# Patient Record
Sex: Female | Born: 1935 | Race: White | Hispanic: No | Smoking: Former smoker
Health system: Southern US, Community
[De-identification: ages and names within clinical notes are randomized; demographics above are authoritative.]

## PROBLEM LIST (undated history)

## (undated) DIAGNOSIS — M810 Age-related osteoporosis without current pathological fracture: Secondary | ICD-10-CM

## (undated) DIAGNOSIS — IMO0001 Reserved for inherently not codable concepts without codable children: Secondary | ICD-10-CM

## (undated) DIAGNOSIS — I350 Nonrheumatic aortic (valve) stenosis: Secondary | ICD-10-CM

## (undated) DIAGNOSIS — I10 Essential (primary) hypertension: Secondary | ICD-10-CM

## (undated) DIAGNOSIS — K635 Polyp of colon: Secondary | ICD-10-CM

## (undated) DIAGNOSIS — J302 Other seasonal allergic rhinitis: Secondary | ICD-10-CM

## (undated) DIAGNOSIS — E7800 Pure hypercholesterolemia, unspecified: Secondary | ICD-10-CM

## (undated) DIAGNOSIS — K449 Diaphragmatic hernia without obstruction or gangrene: Secondary | ICD-10-CM

## (undated) DIAGNOSIS — H9190 Unspecified hearing loss, unspecified ear: Secondary | ICD-10-CM

## (undated) DIAGNOSIS — Z85828 Personal history of other malignant neoplasm of skin: Secondary | ICD-10-CM

## (undated) HISTORY — DX: Age-related osteoporosis without current pathological fracture: M81.0

## (undated) HISTORY — DX: Nonrheumatic aortic (valve) stenosis: I35.0

## (undated) HISTORY — DX: Polyp of colon: K63.5

## (undated) HISTORY — DX: Pure hypercholesterolemia, unspecified: E78.00

## (undated) HISTORY — DX: Other seasonal allergic rhinitis: J30.2

## (undated) HISTORY — DX: Personal history of other malignant neoplasm of skin: Z85.828

## (undated) HISTORY — DX: Reserved for inherently not codable concepts without codable children: IMO0001

## (undated) HISTORY — DX: Diaphragmatic hernia without obstruction or gangrene: K44.9

## (undated) HISTORY — DX: Unspecified hearing loss, unspecified ear: H91.90

## (undated) HISTORY — DX: Essential (primary) hypertension: I10

---

## 1998-10-09 ENCOUNTER — Other Ambulatory Visit: Admission: RE | Admit: 1998-10-09 | Discharge: 1998-10-09 | Payer: Self-pay | Admitting: *Deleted

## 1999-09-06 ENCOUNTER — Emergency Department (HOSPITAL_COMMUNITY): Admission: EM | Admit: 1999-09-06 | Discharge: 1999-09-06 | Payer: Self-pay | Admitting: Emergency Medicine

## 1999-12-17 ENCOUNTER — Encounter: Payer: Self-pay | Admitting: Geriatric Medicine

## 1999-12-17 ENCOUNTER — Encounter: Admission: RE | Admit: 1999-12-17 | Discharge: 1999-12-17 | Payer: Self-pay | Admitting: Geriatric Medicine

## 2000-04-21 ENCOUNTER — Other Ambulatory Visit: Admission: RE | Admit: 2000-04-21 | Discharge: 2000-04-21 | Payer: Self-pay | Admitting: *Deleted

## 2001-01-19 ENCOUNTER — Encounter: Admission: RE | Admit: 2001-01-19 | Discharge: 2001-04-19 | Payer: Self-pay | Admitting: Radiation Oncology

## 2001-03-03 ENCOUNTER — Encounter: Admission: RE | Admit: 2001-03-03 | Discharge: 2001-03-03 | Payer: Self-pay | Admitting: Geriatric Medicine

## 2001-03-03 ENCOUNTER — Encounter: Payer: Self-pay | Admitting: Geriatric Medicine

## 2001-04-14 ENCOUNTER — Encounter: Admission: RE | Admit: 2001-04-14 | Discharge: 2001-04-14 | Payer: Self-pay | Admitting: Geriatric Medicine

## 2001-04-14 ENCOUNTER — Encounter: Payer: Self-pay | Admitting: Geriatric Medicine

## 2001-04-24 ENCOUNTER — Other Ambulatory Visit: Admission: RE | Admit: 2001-04-24 | Discharge: 2001-04-24 | Payer: Self-pay | Admitting: *Deleted

## 2001-05-11 ENCOUNTER — Encounter: Payer: Self-pay | Admitting: Geriatric Medicine

## 2001-05-11 ENCOUNTER — Encounter: Admission: RE | Admit: 2001-05-11 | Discharge: 2001-05-11 | Payer: Self-pay | Admitting: Geriatric Medicine

## 2003-06-11 ENCOUNTER — Other Ambulatory Visit: Admission: RE | Admit: 2003-06-11 | Discharge: 2003-06-11 | Payer: Self-pay | Admitting: Geriatric Medicine

## 2005-05-12 ENCOUNTER — Ambulatory Visit (HOSPITAL_COMMUNITY): Admission: RE | Admit: 2005-05-12 | Discharge: 2005-05-12 | Payer: Self-pay | Admitting: Geriatric Medicine

## 2005-09-17 ENCOUNTER — Emergency Department (HOSPITAL_COMMUNITY): Admission: EM | Admit: 2005-09-17 | Discharge: 2005-09-17 | Payer: Self-pay | Admitting: Family Medicine

## 2005-12-31 ENCOUNTER — Other Ambulatory Visit: Admission: RE | Admit: 2005-12-31 | Discharge: 2005-12-31 | Payer: Self-pay | Admitting: Urology

## 2006-06-07 ENCOUNTER — Encounter: Admission: RE | Admit: 2006-06-07 | Discharge: 2006-06-07 | Payer: Self-pay | Admitting: Geriatric Medicine

## 2009-12-03 ENCOUNTER — Encounter: Admission: RE | Admit: 2009-12-03 | Discharge: 2009-12-03 | Payer: Self-pay | Admitting: Geriatric Medicine

## 2009-12-11 ENCOUNTER — Encounter: Admission: RE | Admit: 2009-12-11 | Discharge: 2009-12-11 | Payer: Self-pay | Admitting: Geriatric Medicine

## 2009-12-31 ENCOUNTER — Other Ambulatory Visit: Admission: RE | Admit: 2009-12-31 | Discharge: 2009-12-31 | Payer: Self-pay | Admitting: Interventional Radiology

## 2009-12-31 ENCOUNTER — Encounter: Admission: RE | Admit: 2009-12-31 | Discharge: 2009-12-31 | Payer: Self-pay | Admitting: Geriatric Medicine

## 2010-10-25 ENCOUNTER — Encounter: Payer: Self-pay | Admitting: Geriatric Medicine

## 2011-01-07 ENCOUNTER — Other Ambulatory Visit: Payer: Self-pay | Admitting: Geriatric Medicine

## 2011-01-07 DIAGNOSIS — E041 Nontoxic single thyroid nodule: Secondary | ICD-10-CM

## 2011-01-08 ENCOUNTER — Ambulatory Visit
Admission: RE | Admit: 2011-01-08 | Discharge: 2011-01-08 | Disposition: A | Discharge status: Home / Self Care | Payer: Medicare Other | Source: Ambulatory Visit | Attending: Geriatric Medicine | Admitting: Geriatric Medicine

## 2011-01-08 DIAGNOSIS — E041 Nontoxic single thyroid nodule: Secondary | ICD-10-CM

## 2012-01-10 ENCOUNTER — Other Ambulatory Visit: Payer: Self-pay | Admitting: Geriatric Medicine

## 2012-01-12 ENCOUNTER — Ambulatory Visit
Admission: RE | Admit: 2012-01-12 | Discharge: 2012-01-12 | Disposition: A | Discharge status: Home / Self Care | Payer: Medicare Other | Source: Ambulatory Visit | Attending: Geriatric Medicine | Admitting: Geriatric Medicine

## 2012-03-23 ENCOUNTER — Other Ambulatory Visit: Payer: Self-pay | Admitting: Dermatology

## 2012-06-01 ENCOUNTER — Other Ambulatory Visit: Payer: Self-pay | Admitting: Dermatology

## 2013-05-08 ENCOUNTER — Other Ambulatory Visit: Payer: Self-pay | Admitting: Dermatology

## 2014-01-21 ENCOUNTER — Ambulatory Visit: Payer: Medicare Other | Admitting: Family Medicine

## 2014-04-11 ENCOUNTER — Other Ambulatory Visit (HOSPITAL_COMMUNITY): Payer: Self-pay | Admitting: Internal Medicine

## 2014-04-11 DIAGNOSIS — R0600 Dyspnea, unspecified: Secondary | ICD-10-CM

## 2014-04-15 ENCOUNTER — Ambulatory Visit (HOSPITAL_COMMUNITY)
Admission: RE | Admit: 2014-04-15 | Discharge: 2014-04-15 | Disposition: A | Discharge status: Home / Self Care | Payer: Medicare HMO | Source: Ambulatory Visit | Attending: Cardiology | Admitting: Cardiology

## 2014-04-15 DIAGNOSIS — R0600 Dyspnea, unspecified: Secondary | ICD-10-CM

## 2014-04-15 DIAGNOSIS — R0609 Other forms of dyspnea: Secondary | ICD-10-CM | POA: Insufficient documentation

## 2014-04-15 DIAGNOSIS — R0989 Other specified symptoms and signs involving the circulatory and respiratory systems: Secondary | ICD-10-CM | POA: Insufficient documentation

## 2014-04-15 DIAGNOSIS — I359 Nonrheumatic aortic valve disorder, unspecified: Secondary | ICD-10-CM

## 2014-04-15 NOTE — Progress Notes (Signed)
2D Echo Performed 04/15/2014    Marygrace Drought, RCS

## 2014-06-03 ENCOUNTER — Institutional Professional Consult (permissible substitution): Payer: Medicare HMO | Admitting: Emergency Medicine

## 2014-06-05 ENCOUNTER — Ambulatory Visit (INDEPENDENT_AMBULATORY_CARE_PROVIDER_SITE_OTHER): Payer: Medicare HMO | Admitting: Pulmonary Disease

## 2014-06-05 ENCOUNTER — Ambulatory Visit: Payer: Medicare HMO | Admitting: Pulmonary Disease

## 2014-06-05 ENCOUNTER — Encounter: Payer: Self-pay | Admitting: Pulmonary Disease

## 2014-06-05 VITALS — BP 128/66 | HR 65 | Ht 64.0 in | Wt 108.0 lb

## 2014-06-05 DIAGNOSIS — Z23 Encounter for immunization: Secondary | ICD-10-CM

## 2014-06-05 DIAGNOSIS — J449 Chronic obstructive pulmonary disease, unspecified: Secondary | ICD-10-CM | POA: Insufficient documentation

## 2014-06-05 NOTE — Progress Notes (Signed)
Subjective:    Patient ID: Valerie Villa, female    DOB: 1923-10-24, 80 y.o.   MRN: [id number]  HPI  Valerie Villa is here to see me today because she was recently told that she had COPD.  She said that she saw a new physician recently and she had a pulmonary function test and was told that the had COPD.  She was prescribed albuterol.  She complained of shortness of breath that day.  Specifically she feels that her breathing is not as good as it used to be years ago.  She was sent for PFTs and told that she had COPD and was prescribed albuterol, but she has a lot of questions about this because she has a lot of allergies.   She says that when she climbs a flight of stairs she is a little short of breath and she typically has to rest before another flight.  She said that when she goes to the beach her condo is on the fourth floor.  There she was really struggling to breathe when she went in May.   She has had a dry cough off and on for years.  Sipping water typically helps this.  This has not worsened lately.  Sometimes she will choke when she coughs.  Again, sipping water helps this too.  She smoked briefly in her 84's but quit by age 51.  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  Her husband quit smoking around this time   Past Medical History  Diagnosis Date  . Hypercholesteremia   . Seasonal allergies   . Osteoporosis   . Hearing impairment   . Hx of basal cell carcinoma      Family History  Problem Relation Age of Onset  . Heart disease Mother   . Heart disease Father   . Cancer Sister     throat     History   Social History  . Marital Status: Widowed    Spouse Name: N/A    Number of Children: N/A  . Years of Education: N/A   Occupational History  . Not on file.   Social History Main Topics  . Smoking status: Former Smoker -- 0.10 packs/day for 5 years    Types: Cigarettes    Quit date: 06/05/1964  . Smokeless tobacco: Never  Used     Comment: "social smoker"  . Alcohol Use: No  . Drug Use: No  . Sexual Activity: Not on file   Other Topics Concern  . Not on file   Social History Narrative  . No narrative on file     Allergies  Allergen Reactions  . Actonel [Risedronate Sodium]   . Albuterol   . Altace [Ramipril]   . Atropine   . Biaxin [Clarithromycin]   . Ciprofloxacin   . Clindamycin/Lincomycin   . Fosamax [Alendronate Sodium]   . Hawthorn [Crataegus Oxyacantha]   . Hctz [Hydrochlorothiazide]   . Listerine   . Nasacort [Triamcinolone]   . Neosporin [Neomycin-Bacitracin Zn-Polymyx]   . Polysporin [Bacitracin-Polymyxin B]   . Rhinocort [Budesonide]   . Scope [Antiseptic Mouth Rinse]   . Simvastatin   . Tetracyclines & Related   . Vantin [Cefpodoxime]   . Vioxx [Rofecoxib]   . Xyntha [Antihemophilic Factor (Recomb)]      No outpatient prescriptions prior to visit.   No facility-administered medications prior to visit.     Review of Systems  Constitutional: Positive for  fatigue. Negative for fever and unexpected weight change.  HENT: Positive for congestion. Negative for dental problem, ear pain, nosebleeds, postnasal drip, rhinorrhea, sinus pressure, sneezing, sore throat and trouble swallowing.   Eyes: Negative for redness and itching.  Respiratory: Positive for cough and shortness of breath. Negative for chest tightness and wheezing.   Cardiovascular: Negative for palpitations and leg swelling.  Gastrointestinal: Negative for nausea and vomiting.  Genitourinary: Negative for dysuria.  Musculoskeletal: Negative for joint swelling.  Skin: Negative for rash.  Neurological: Negative for headaches.  Hematological: Does not bruise/bleed easily.  Psychiatric/Behavioral: Negative for dysphoric mood. The patient is not nervous/anxious.        Objective:   Physical Exam Filed Vitals:   06/05/14 1645  BP: 128/66  Pulse: 65  Height: 5\' 4"  (1.626 m)  Weight: 108 lb (48.988 kg)    SpO2: 96%   Gen: well appearing, no acute distress HEENT: NCAT, PERRL, EOMi, OP clear, neck supple without masses PULM: limited air movement, no wheezing CV: RRR, systolic murmur LUSB, no JVD AB: BS+, soft, nontender, no hsm Ext: warm, no edema, no clubbing, no cyanosis Derm: no rash or skin breakdown Neuro: A&Ox4, CN II-XII intact, strength 5/5 in all 4 extremities  2014 CXR > emphysema bilaterally 05/2014 Spirometry > Ratio 67%, FEV1 0.9L (65% pred), clear airflow obstruction     Assessment & Plan:   COPD, mild COPD: GOLD Grade A  She has clear airflow obstruction on simple spirometry, but has a minimal smoking history.  She has minimal symptoms and remains quite active. I explained to her that airflow obstruction can be a normal part of aging.  We discussed the various treatment options. She is very hesitant to try any new medication as she is very sensitive to side effects. She does not feel that albuterol will be very helpful for her. She is here mostly today to find out about her prognosis. I explained to her that COPD is a very slow moving process and I do not expect a rapid decline. Her primary focus should be on preventing respiratory infections.  -O2 therapy: Not indicated -Immunizations: Flu shot given today -Tobacco use: Quit in 1959 -Exercise: Encouraged regular exercise -Bronchodilator therapy: Long discussion about bronchodilator therapy, she prefers to forego any therapy at this point -Exacerbation prevention: Not indicated    Updated Medication List Outpatient Encounter Prescriptions as of 06/05/2014  Medication Sig  . cholecalciferol (VITAMIN D) 1000 UNITS tablet Take 1,000 Units by mouth daily.  . cyanocobalamin 100 MCG tablet Take 100 mcg by mouth daily.  Marland Kitchen saccharomyces boulardii (FLORASTOR) 250 MG capsule Take 250 mg by mouth daily.

## 2014-06-05 NOTE — Patient Instructions (Signed)
Get a flu shot every year Stay active and exercise regularly We will see you back here as needed

## 2014-06-05 NOTE — Assessment & Plan Note (Signed)
COPD: GOLD Grade A  She has clear airflow obstruction on simple spirometry, but has a minimal smoking history.  She has minimal symptoms and remains quite active. I explained to her that airflow obstruction can be a normal part of aging.  We discussed the various treatment options. She is very hesitant to try any new medication as she is very sensitive to side effects. She does not feel that albuterol will be very helpful for her. She is here mostly today to find out about her prognosis. I explained to her that COPD is a very slow moving process and I do not expect a rapid decline. Her primary focus should be on preventing respiratory infections.  -O2 therapy: Not indicated -Immunizations: Flu shot given today -Tobacco use: Quit in 1959 -Exercise: Encouraged regular exercise -Bronchodilator therapy: Long discussion about bronchodilator therapy, she prefers to forego any therapy at this point -Exacerbation prevention: Not indicated

## 2014-07-16 ENCOUNTER — Telehealth (HOSPITAL_COMMUNITY): Payer: Self-pay

## 2014-07-16 NOTE — Telephone Encounter (Signed)
Called patient to inquire about Pulmonary Rehab.  Patient states that she is currently attending exercise classes 4 days a week with [employer].  Patient was encouraged to contact us in the future if she is interested in attending the program.

## 2015-04-25 ENCOUNTER — Other Ambulatory Visit: Payer: Self-pay | Admitting: Geriatric Medicine

## 2015-04-25 DIAGNOSIS — E041 Nontoxic single thyroid nodule: Secondary | ICD-10-CM

## 2015-04-28 ENCOUNTER — Ambulatory Visit
Admission: RE | Admit: 2015-04-28 | Discharge: 2015-04-28 | Disposition: A | Discharge status: Home / Self Care | Payer: PPO | Source: Ambulatory Visit | Attending: Geriatric Medicine | Admitting: Geriatric Medicine

## 2015-04-28 DIAGNOSIS — E041 Nontoxic single thyroid nodule: Secondary | ICD-10-CM

## 2015-06-24 ENCOUNTER — Other Ambulatory Visit: Payer: Self-pay

## 2015-06-24 DIAGNOSIS — I739 Peripheral vascular disease, unspecified: Secondary | ICD-10-CM

## 2015-08-08 ENCOUNTER — Encounter: Payer: Self-pay | Admitting: Vascular Surgery

## 2015-08-12 ENCOUNTER — Encounter: Payer: Self-pay | Admitting: Vascular Surgery

## 2015-08-12 ENCOUNTER — Ambulatory Visit (INDEPENDENT_AMBULATORY_CARE_PROVIDER_SITE_OTHER): Payer: PPO | Admitting: Vascular Surgery

## 2015-08-12 ENCOUNTER — Ambulatory Visit (HOSPITAL_COMMUNITY)
Admission: RE | Admit: 2015-08-12 | Discharge: 2015-08-12 | Disposition: A | Discharge status: Home / Self Care | Payer: PPO | Source: Ambulatory Visit | Attending: Vascular Surgery | Admitting: Vascular Surgery

## 2015-08-12 ENCOUNTER — Ambulatory Visit (INDEPENDENT_AMBULATORY_CARE_PROVIDER_SITE_OTHER)
Admission: RE | Admit: 2015-08-12 | Discharge: 2015-08-12 | Disposition: A | Discharge status: Home / Self Care | Payer: PPO | Source: Ambulatory Visit | Attending: Vascular Surgery | Admitting: Vascular Surgery

## 2015-08-12 VITALS — BP 148/75 | HR 77 | Temp 97.8°F | Resp 16 | Ht 64.0 in | Wt 106.0 lb

## 2015-08-12 DIAGNOSIS — I739 Peripheral vascular disease, unspecified: Secondary | ICD-10-CM | POA: Diagnosis not present

## 2015-08-12 DIAGNOSIS — I70213 Atherosclerosis of native arteries of extremities with intermittent claudication, bilateral legs: Secondary | ICD-10-CM

## 2015-08-12 NOTE — Progress Notes (Signed)
Filed Vitals:   08/12/15 1419 08/12/15 1422  BP: 143/76 148/75  Pulse: 77 77  Temp: 97.8 F (36.6 C)   Resp: 16   Height: 5\' 4"  (1.626 m)   Weight: 106 lb (48.081 kg)   SpO2: 97%

## 2015-08-12 NOTE — Progress Notes (Signed)
[facility]  Patient name: Valerie Villa MRN: [id number] DOB: Dec 25, 1923 Sex: female  REASON FOR CONSULT: Bilateral lower extremity claudication  HPI: Valerie Villa is a 81 y.o. female, who is her today for evaluation of bilateral lower extremity calf claudication. She is a very pleasant active 81 year old female. She reports long history of bilateral calf claudication which is been tolerable to her. She has a long history of multiple basal cell cancers over her body and recently underwent "freezing" of several basal both lower extremities. She did have some erythema over her left calf following this and she was concern regarding tissue loss. She reports that this is now healed completely. She is here today for discussion of claudication symptoms. She does not have any resting symptoms. She is able to do her usual daily activities without pain. If she tries to walk any more than this she does have limitation. She does have shortness of breath with exertion and therefore reports that her leg pain or shortness of breath. Certainly felt same time. She does have a history of aortic stenosis but no coronary artery disease  Past Medical History  Diagnosis Date  . Hypercholesteremia   . Seasonal allergies   . Osteoporosis   . Hearing impairment   . Hx of basal cell carcinoma   . Hypertension   . Colon polyps   . Mild aortic stenosis   . Hiatal hernia     Family History  Problem Relation Age of Onset  . Heart disease Mother   . Heart disease Father   . Cancer Sister     throat    SOCIAL HISTORY: Social History   Social History  . Marital Status: Widowed    Spouse Name: N/A  . Number of Children: N/A  . Years of Education: N/A   Occupational History  . Not on file.   Social History Main Topics  . Smoking status: Former Smoker -- 0.10 packs/day for 5 years    Types: Cigarettes    Quit date: 06/05/1964  . Smokeless tobacco: Never Used     Comment:  "social smoker"  . Alcohol Use: No  . Drug Use: No  . Sexual Activity: Not on file   Other Topics Concern  . Not on file   Social History Narrative    Allergies  Allergen Reactions  . Actonel [Risedronate Sodium]   . Albuterol   . Altace [Ramipril]   . Atropine   . Biaxin [Clarithromycin]   . Ciprofloxacin   . Clindamycin/Lincomycin   . Fosamax [Alendronate Sodium]   . Hawthorn [Crataegus Oxyacantha]   . Hctz [Hydrochlorothiazide]   . Listerine   . Nasacort [Triamcinolone]   . Neosporin [Neomycin-Bacitracin Zn-Polymyx]   . Polysporin [Bacitracin-Polymyxin B]   . Rhinocort [Budesonide]   . Scope [Antiseptic Mouth Rinse]   . Simvastatin   . Tetracyclines & Related   . Vantin [Cefpodoxime]   . Vioxx [Rofecoxib]   . Xyntha [Antihemophilic Factor (Recomb)]     Current Outpatient Prescriptions  Medication Sig Dispense Refill  . Calcium Carbonate Antacid (TUMS PO) Take by mouth daily.    . cholecalciferol (VITAMIN D) 1000 UNITS tablet Take 1,000 Units by mouth daily.    . cyanocobalamin 100 MCG tablet Take 100 mcg by mouth daily.    . Multiple Vitamins-Minerals (CENTRUM FLAVOR BURST ADULT) CHEW Chew by mouth daily.    Marland Kitchen saccharomyces boulardii (FLORASTOR) 250 MG capsule Take 250 mg by mouth daily.  No current facility-administered medications for this visit.    REVIEW OF SYSTEMS:  [X]  denotes positive finding, [ ]  denotes negative finding Cardiac  Comments:  Chest pain or chest pressure:    Shortness of breath upon exertion: x   Short of breath when lying flat:    Irregular heart rhythm:        Vascular    Pain in calf, thigh, or hip brought on by ambulation: x   Pain in feet at night that wakes you up from your sleep:     Blood clot in your veins:    Leg swelling:         Pulmonary    Oxygen at home:    Productive cough:     Wheezing:         Neurologic    Sudden weakness in arms or legs:     Sudden numbness in arms or legs:     Sudden onset of  difficulty speaking or slurred speech:    Temporary loss of vision in one eye:     Problems with dizziness:         Gastrointestinal    Blood in stool:     Vomited blood:         Genitourinary    Burning when urinating:     Blood in urine:        Psychiatric    Major depression:         Hematologic    Bleeding problems:    Problems with blood clotting too easily:        Skin    Rashes or ulcers:        Constitutional    Fever or chills:      PHYSICAL EXAM: Filed Vitals:   08/12/15 1419 08/12/15 1422  BP: 143/76 148/75  Pulse: 77 77  Temp: 97.8 F (36.6 C)   Resp: 16   Height: 5\' 4"  (1.626 m)   Weight: 106 lb (48.081 kg)   SpO2: 97%     GENERAL: The patient is a well-nourished female, in no acute distress. The vital signs are documented above. CARDIAC: There is a regular rate and rhythm. Does have a systolic murmur VASCULAR: Palpable radial pulses bilaterally and palpable femoral pulses bilaterally. I do not palpate popliteal or distal pulses. She does have bruits throughout her neck bilaterally but this does appear to be transmitted from her heart murmur. PULMONARY: There is good air exchange bilaterally without wheezing or rales. ABDOMEN: Soft and non-tender with normal pitched bowel sounds. No bruit noted MUSCULOSKELETAL: There are no major deformities or cyanosis. NEUROLOGIC: No focal weakness or paresthesias are detected. SKIN: There are no ulcers or rashes noted. He does have multiple areas of prior basal cell was scarring over both lower extremities PSYCHIATRIC: The patient has a normal affect.  DATA:  Noninvasive lower extremity studies reveal biphasic femoral waveforms and monophasic distal waveforms bilaterally. She does have highly reduced ankle arm index at 0.80 on the right and 0.88 on the left.  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 I had a long discussion regarding this. X-ray that she is at low risk for limb loss related to this. I did explain the importance of walking program and she should continue this. She knows to notify should she develop any tissue loss are difficult to heal lesions. Otherwise we'll  continue her walking program. If she had progressive symptoms or tissue loss, with arteriography for further evaluation   Jerame Hedding [facility] [employer]: [phone]

## 2015-11-28 DIAGNOSIS — Z85828 Personal history of other malignant neoplasm of skin: Secondary | ICD-10-CM | POA: Diagnosis not present

## 2015-11-28 DIAGNOSIS — D1801 Hemangioma of skin and subcutaneous tissue: Secondary | ICD-10-CM | POA: Diagnosis not present

## 2015-11-28 DIAGNOSIS — L821 Other seborrheic keratosis: Secondary | ICD-10-CM | POA: Diagnosis not present

## 2015-11-28 DIAGNOSIS — D485 Neoplasm of uncertain behavior of skin: Secondary | ICD-10-CM | POA: Diagnosis not present

## 2015-11-28 DIAGNOSIS — L570 Actinic keratosis: Secondary | ICD-10-CM | POA: Diagnosis not present

## 2015-11-28 DIAGNOSIS — L812 Freckles: Secondary | ICD-10-CM | POA: Diagnosis not present

## 2016-04-30 ENCOUNTER — Other Ambulatory Visit: Payer: Self-pay | Admitting: Geriatric Medicine

## 2016-04-30 DIAGNOSIS — Z79899 Other long term (current) drug therapy: Secondary | ICD-10-CM | POA: Diagnosis not present

## 2016-04-30 DIAGNOSIS — E041 Nontoxic single thyroid nodule: Secondary | ICD-10-CM

## 2016-04-30 DIAGNOSIS — N183 Chronic kidney disease, stage 3 (moderate): Secondary | ICD-10-CM | POA: Diagnosis not present

## 2016-04-30 DIAGNOSIS — Z1389 Encounter for screening for other disorder: Secondary | ICD-10-CM | POA: Diagnosis not present

## 2016-04-30 DIAGNOSIS — I519 Heart disease, unspecified: Secondary | ICD-10-CM | POA: Diagnosis not present

## 2016-04-30 DIAGNOSIS — Q253 Supravalvular aortic stenosis: Secondary | ICD-10-CM | POA: Diagnosis not present

## 2016-04-30 DIAGNOSIS — M810 Age-related osteoporosis without current pathological fracture: Secondary | ICD-10-CM | POA: Diagnosis not present

## 2016-04-30 DIAGNOSIS — I10 Essential (primary) hypertension: Secondary | ICD-10-CM | POA: Diagnosis not present

## 2016-04-30 DIAGNOSIS — E7800 Pure hypercholesterolemia, unspecified: Secondary | ICD-10-CM | POA: Diagnosis not present

## 2016-04-30 DIAGNOSIS — Z0000 Encounter for general adult medical examination without abnormal findings: Secondary | ICD-10-CM | POA: Diagnosis not present

## 2016-04-30 DIAGNOSIS — J449 Chronic obstructive pulmonary disease, unspecified: Secondary | ICD-10-CM | POA: Diagnosis not present

## 2016-04-30 DIAGNOSIS — I739 Peripheral vascular disease, unspecified: Secondary | ICD-10-CM | POA: Diagnosis not present

## 2016-05-03 ENCOUNTER — Other Ambulatory Visit: Payer: Self-pay | Admitting: Geriatric Medicine

## 2016-05-03 DIAGNOSIS — I350 Nonrheumatic aortic (valve) stenosis: Secondary | ICD-10-CM

## 2016-05-05 ENCOUNTER — Ambulatory Visit
Admission: RE | Admit: 2016-05-05 | Discharge: 2016-05-05 | Disposition: A | Discharge status: Home / Self Care | Payer: PPO | Source: Ambulatory Visit | Attending: Geriatric Medicine | Admitting: Geriatric Medicine

## 2016-05-05 DIAGNOSIS — E041 Nontoxic single thyroid nodule: Secondary | ICD-10-CM

## 2016-05-05 DIAGNOSIS — E042 Nontoxic multinodular goiter: Secondary | ICD-10-CM | POA: Diagnosis not present

## 2016-05-06 ENCOUNTER — Ambulatory Visit (HOSPITAL_COMMUNITY): Payer: PPO | Attending: Cardiology

## 2016-05-06 ENCOUNTER — Other Ambulatory Visit: Payer: Self-pay

## 2016-05-06 ENCOUNTER — Encounter (INDEPENDENT_AMBULATORY_CARE_PROVIDER_SITE_OTHER): Payer: Self-pay

## 2016-05-06 DIAGNOSIS — I272 Other secondary pulmonary hypertension: Secondary | ICD-10-CM | POA: Insufficient documentation

## 2016-05-06 DIAGNOSIS — Z87891 Personal history of nicotine dependence: Secondary | ICD-10-CM | POA: Diagnosis not present

## 2016-05-06 DIAGNOSIS — J449 Chronic obstructive pulmonary disease, unspecified: Secondary | ICD-10-CM | POA: Diagnosis not present

## 2016-05-06 DIAGNOSIS — Q231 Congenital insufficiency of aortic valve: Secondary | ICD-10-CM | POA: Insufficient documentation

## 2016-05-06 DIAGNOSIS — I350 Nonrheumatic aortic (valve) stenosis: Secondary | ICD-10-CM | POA: Insufficient documentation

## 2016-05-06 DIAGNOSIS — I340 Nonrheumatic mitral (valve) insufficiency: Secondary | ICD-10-CM | POA: Insufficient documentation

## 2016-05-20 DIAGNOSIS — Z961 Presence of intraocular lens: Secondary | ICD-10-CM | POA: Diagnosis not present

## 2016-05-27 DIAGNOSIS — Z85828 Personal history of other malignant neoplasm of skin: Secondary | ICD-10-CM | POA: Diagnosis not present

## 2016-05-27 DIAGNOSIS — L821 Other seborrheic keratosis: Secondary | ICD-10-CM | POA: Diagnosis not present

## 2016-07-28 DIAGNOSIS — Z23 Encounter for immunization: Secondary | ICD-10-CM | POA: Diagnosis not present

## 2016-11-01 DIAGNOSIS — I739 Peripheral vascular disease, unspecified: Secondary | ICD-10-CM | POA: Diagnosis not present

## 2016-11-01 DIAGNOSIS — Z23 Encounter for immunization: Secondary | ICD-10-CM | POA: Diagnosis not present

## 2016-11-01 DIAGNOSIS — J449 Chronic obstructive pulmonary disease, unspecified: Secondary | ICD-10-CM | POA: Diagnosis not present

## 2016-11-01 DIAGNOSIS — I129 Hypertensive chronic kidney disease with stage 1 through stage 4 chronic kidney disease, or unspecified chronic kidney disease: Secondary | ICD-10-CM | POA: Diagnosis not present

## 2016-11-01 DIAGNOSIS — N183 Chronic kidney disease, stage 3 (moderate): Secondary | ICD-10-CM | POA: Diagnosis not present

## 2016-11-30 DIAGNOSIS — D0461 Carcinoma in situ of skin of right upper limb, including shoulder: Secondary | ICD-10-CM | POA: Diagnosis not present

## 2016-11-30 DIAGNOSIS — L821 Other seborrheic keratosis: Secondary | ICD-10-CM | POA: Diagnosis not present

## 2016-11-30 DIAGNOSIS — L218 Other seborrheic dermatitis: Secondary | ICD-10-CM | POA: Diagnosis not present

## 2016-11-30 DIAGNOSIS — D1801 Hemangioma of skin and subcutaneous tissue: Secondary | ICD-10-CM | POA: Diagnosis not present

## 2016-11-30 DIAGNOSIS — D0471 Carcinoma in situ of skin of right lower limb, including hip: Secondary | ICD-10-CM | POA: Diagnosis not present

## 2016-11-30 DIAGNOSIS — D485 Neoplasm of uncertain behavior of skin: Secondary | ICD-10-CM | POA: Diagnosis not present

## 2016-11-30 DIAGNOSIS — Z85828 Personal history of other malignant neoplasm of skin: Secondary | ICD-10-CM | POA: Diagnosis not present

## 2016-11-30 DIAGNOSIS — D692 Other nonthrombocytopenic purpura: Secondary | ICD-10-CM | POA: Diagnosis not present

## 2016-12-03 DIAGNOSIS — D1801 Hemangioma of skin and subcutaneous tissue: Secondary | ICD-10-CM | POA: Diagnosis not present

## 2016-12-03 DIAGNOSIS — D692 Other nonthrombocytopenic purpura: Secondary | ICD-10-CM | POA: Diagnosis not present

## 2016-12-03 DIAGNOSIS — Z85828 Personal history of other malignant neoplasm of skin: Secondary | ICD-10-CM | POA: Diagnosis not present

## 2016-12-03 DIAGNOSIS — D0471 Carcinoma in situ of skin of right lower limb, including hip: Secondary | ICD-10-CM | POA: Diagnosis not present

## 2016-12-03 DIAGNOSIS — D485 Neoplasm of uncertain behavior of skin: Secondary | ICD-10-CM | POA: Diagnosis not present

## 2016-12-03 DIAGNOSIS — L821 Other seborrheic keratosis: Secondary | ICD-10-CM | POA: Diagnosis not present

## 2016-12-03 DIAGNOSIS — L218 Other seborrheic dermatitis: Secondary | ICD-10-CM | POA: Diagnosis not present

## 2016-12-03 DIAGNOSIS — D0461 Carcinoma in situ of skin of right upper limb, including shoulder: Secondary | ICD-10-CM | POA: Diagnosis not present

## 2017-01-25 DIAGNOSIS — H01024 Squamous blepharitis left upper eyelid: Secondary | ICD-10-CM | POA: Diagnosis not present

## 2017-01-25 DIAGNOSIS — H01021 Squamous blepharitis right upper eyelid: Secondary | ICD-10-CM | POA: Diagnosis not present

## 2017-01-25 DIAGNOSIS — H01025 Squamous blepharitis left lower eyelid: Secondary | ICD-10-CM | POA: Diagnosis not present

## 2017-01-25 DIAGNOSIS — H01022 Squamous blepharitis right lower eyelid: Secondary | ICD-10-CM | POA: Diagnosis not present

## 2017-02-15 DIAGNOSIS — H01024 Squamous blepharitis left upper eyelid: Secondary | ICD-10-CM | POA: Diagnosis not present

## 2017-02-15 DIAGNOSIS — H01025 Squamous blepharitis left lower eyelid: Secondary | ICD-10-CM | POA: Diagnosis not present

## 2017-02-15 DIAGNOSIS — H01022 Squamous blepharitis right lower eyelid: Secondary | ICD-10-CM | POA: Diagnosis not present

## 2017-02-15 DIAGNOSIS — H01021 Squamous blepharitis right upper eyelid: Secondary | ICD-10-CM | POA: Diagnosis not present

## 2017-03-01 DIAGNOSIS — H01022 Squamous blepharitis right lower eyelid: Secondary | ICD-10-CM | POA: Diagnosis not present

## 2017-03-01 DIAGNOSIS — H01025 Squamous blepharitis left lower eyelid: Secondary | ICD-10-CM | POA: Diagnosis not present

## 2017-03-01 DIAGNOSIS — H01021 Squamous blepharitis right upper eyelid: Secondary | ICD-10-CM | POA: Diagnosis not present

## 2017-03-01 DIAGNOSIS — H01024 Squamous blepharitis left upper eyelid: Secondary | ICD-10-CM | POA: Diagnosis not present

## 2017-04-13 DIAGNOSIS — D485 Neoplasm of uncertain behavior of skin: Secondary | ICD-10-CM | POA: Diagnosis not present

## 2017-04-13 DIAGNOSIS — Z961 Presence of intraocular lens: Secondary | ICD-10-CM | POA: Diagnosis not present

## 2017-04-13 DIAGNOSIS — H0015 Chalazion left lower eyelid: Secondary | ICD-10-CM | POA: Diagnosis not present

## 2017-04-13 DIAGNOSIS — H5211 Myopia, right eye: Secondary | ICD-10-CM | POA: Diagnosis not present

## 2017-04-26 DIAGNOSIS — H0015 Chalazion left lower eyelid: Secondary | ICD-10-CM | POA: Diagnosis not present

## 2017-05-17 ENCOUNTER — Other Ambulatory Visit: Payer: Self-pay | Admitting: Geriatric Medicine

## 2017-05-17 DIAGNOSIS — N183 Chronic kidney disease, stage 3 (moderate): Secondary | ICD-10-CM | POA: Diagnosis not present

## 2017-05-17 DIAGNOSIS — I10 Essential (primary) hypertension: Secondary | ICD-10-CM | POA: Diagnosis not present

## 2017-05-17 DIAGNOSIS — Q253 Supravalvular aortic stenosis: Secondary | ICD-10-CM | POA: Diagnosis not present

## 2017-05-17 DIAGNOSIS — E041 Nontoxic single thyroid nodule: Secondary | ICD-10-CM | POA: Diagnosis not present

## 2017-05-17 DIAGNOSIS — I519 Heart disease, unspecified: Secondary | ICD-10-CM | POA: Diagnosis not present

## 2017-05-17 DIAGNOSIS — E7800 Pure hypercholesterolemia, unspecified: Secondary | ICD-10-CM | POA: Diagnosis not present

## 2017-05-17 DIAGNOSIS — Z0000 Encounter for general adult medical examination without abnormal findings: Secondary | ICD-10-CM | POA: Diagnosis not present

## 2017-05-17 DIAGNOSIS — I739 Peripheral vascular disease, unspecified: Secondary | ICD-10-CM | POA: Diagnosis not present

## 2017-05-17 DIAGNOSIS — J449 Chronic obstructive pulmonary disease, unspecified: Secondary | ICD-10-CM | POA: Diagnosis not present

## 2017-05-17 DIAGNOSIS — K219 Gastro-esophageal reflux disease without esophagitis: Secondary | ICD-10-CM | POA: Diagnosis not present

## 2017-05-17 DIAGNOSIS — Z1389 Encounter for screening for other disorder: Secondary | ICD-10-CM | POA: Diagnosis not present

## 2017-05-17 DIAGNOSIS — Z79899 Other long term (current) drug therapy: Secondary | ICD-10-CM | POA: Diagnosis not present

## 2017-05-18 ENCOUNTER — Other Ambulatory Visit: Payer: Self-pay | Admitting: Geriatric Medicine

## 2017-05-18 DIAGNOSIS — Q253 Supravalvular aortic stenosis: Secondary | ICD-10-CM

## 2017-05-23 ENCOUNTER — Ambulatory Visit (HOSPITAL_COMMUNITY): Payer: PPO | Attending: Cardiovascular Disease

## 2017-05-23 DIAGNOSIS — Q253 Supravalvular aortic stenosis: Secondary | ICD-10-CM

## 2017-05-30 ENCOUNTER — Encounter: Payer: Self-pay | Admitting: Cardiology

## 2017-05-30 ENCOUNTER — Ambulatory Visit (INDEPENDENT_AMBULATORY_CARE_PROVIDER_SITE_OTHER): Payer: PPO | Admitting: Cardiology

## 2017-05-30 VITALS — BP 104/66 | HR 78 | Ht 64.0 in | Wt 104.0 lb

## 2017-05-30 DIAGNOSIS — I350 Nonrheumatic aortic (valve) stenosis: Secondary | ICD-10-CM

## 2017-05-30 DIAGNOSIS — I739 Peripheral vascular disease, unspecified: Secondary | ICD-10-CM | POA: Diagnosis not present

## 2017-05-30 NOTE — Patient Instructions (Signed)
Medication Instructions:  The current medical regimen is effective;  continue present plan and medications.  Testing/Procedures: Your physician has requested that you have an echocardiogram in 1 year about 1 week before you see Dr Marlou Porch. Echocardiography is a painless test that uses sound waves to create images of your heart. It provides your doctor with information about the size and shape of your heart and how well your heart's chambers and valves are working. This procedure takes approximately one hour. There are no restrictions for this procedure.  Follow-Up: Follow up in 1 year with Dr. Marlou Porch.  You will receive a letter in the mail 2 months before you are due.  Please call us when you receive this letter to schedule your follow up appointment.  If you need a refill on your cardiac medications before your next appointment, please call your pharmacy.  Thank you for choosing [facility]!!

## 2017-05-30 NOTE — Progress Notes (Signed)
Cardiology Office Note:    Date:  05/30/2017   ID:  Valerie Villa, DOB [birth year], MRN [id number]  PCP:  Lajean Manes, MD  Cardiologist:  Candee Furbish, MD    Referring MD: Lajean Manes, MD     History of Present Illness:    Valerie Villa is a 83 y.o. female here for evaluation of aortic stenosis. Echocardiogram on 05/23/17 demonstrated Aortic valve peak velocity is 2.9 m/s, mean gradient of 20 mmHg. Aortic valve area was calculated as 0.82 cm.  She goes to the gym 4 days a week, exercises well without any limitations. She has not noticed any change, no significant dyspnea on exertion, no syncope, no anginal symptoms, no bleeding.  She is very excited that she does not take any medications.  Past Medical History:  Diagnosis Date  . Colon polyps   . Hearing impairment   . Hiatal hernia   . Hx of basal cell carcinoma   . Hypercholesteremia   . Hypertension   . Mild aortic stenosis   . Osteoporosis   . Seasonal allergies     No past surgical history on file.  Current Medications: Current Meds  Medication Sig  . Calcium Carbonate Antacid (TUMS PO) Take by mouth daily.  . cholecalciferol (VITAMIN D) 1000 UNITS tablet Take 1,000 Units by mouth daily.  . cyanocobalamin 100 MCG tablet Take 100 mcg by mouth daily.  . Multiple Vitamins-Minerals (CENTRUM FLAVOR BURST ADULT) CHEW Chew by mouth daily.  Marland Kitchen saccharomyces boulardii (FLORASTOR) 250 MG capsule Take 250 mg by mouth daily.     Allergies:   Actonel [risedronate sodium]; Albuterol; Altace [ramipril]; Atropine; Biaxin [clarithromycin]; Ciprofloxacin; Clindamycin/lincomycin; Fosamax [alendronate sodium]; Hawthorn [crataegus oxyacantha]; Hctz [hydrochlorothiazide]; Listerine; Nasacort [triamcinolone]; Neosporin [neomycin-bacitracin zn-polymyx]; Polysporin [bacitracin-polymyxin b]; Rhinocort [budesonide]; Scope [antiseptic mouth rinse]; Simvastatin; Tetracyclines & related; Vantin [cefpodoxime]; Vioxx [rofecoxib]; and Xyntha  [antihemophilic factor (recomb)]   Social History   Social History  . Marital status: Widowed    Spouse name: N/A  . Number of children: N/A  . Years of education: N/A   Social History Main Topics  . Smoking status: Former Smoker    Packs/day: 0.10    Years: 5.00    Types: Cigarettes    Quit date: 06/05/1964  . Smokeless tobacco: Never Used     Comment: "social smoker"  . Alcohol use No  . Drug use: No  . Sexual activity: Not Asked   Other Topics Concern  . None   Social History Narrative  . None     Family History: The patient's family history includes Cancer in her sister; Heart disease in her father and mother.  ROS:   Please see the history of present illness.   No syncope, no anginal symptoms, no shortness of breath.  All other systems reviewed and are negative.  EKGs/Labs/Other Studies Reviewed:    The following studies were reviewed today: Echocardiogram reviewed as above  EKG:  EKG is  ordered today.  The ekg ordered today demonstrates Sinus rhythm 78 with nonspecific ST-T wave changes vertical axis.  Recent Labs: No results found for requested labs within last 8760 hours.  Recent Lipid Panel No results found for: CHOL, TRIG, HDL, CHOLHDL, VLDL, LDLCALC, LDLDIRECT   LDL 144, HDL 76, hemoglobin 14.6, creatinine 0.9, ALT 14, TSH 2.4  Physical Exam:    VS:  BP 104/66   Pulse 78   Ht 5\' 4"  (1.626 m)   Wt 104 lb (47.2 kg)   LMP  (LMP  Unknown)   BMI 17.85 kg/m     Wt Readings from Last 3 Encounters:  05/30/17 104 lb (47.2 kg)  08/12/15 106 lb (48.1 kg)  06/05/14 108 lb (49 kg)     GEN:  Thin in no acute distress. Elderly HEENT: Normal NECK: No JVD; No carotid bruits LYMPHATICS: No lymphadenopathy CARDIAC: RRR, 2/6 S mid peak RUSB,no rubs, gallops RESPIRATORY:  Clear to auscultation without rales, wheezing or rhonchi  ABDOMEN: Soft, non-tender, non-distended MUSCULOSKELETAL:  No edema; No deformity  SKIN: Warm and dry NEUROLOGIC:  Alert and  oriented x 3 PSYCHIATRIC:  Normal affect   ASSESSMENT:    1. Nonrheumatic aortic valve stenosis   2. PVD (peripheral vascular disease) ([place])    PLAN:    In order of problems listed above:  Aortic stenosis  - Still remains in the moderate category based upon valve gradient/velocity. We will repeat echocardiogram in one year. She does not require aortic valve replacement at this time although given her advanced age, TAVR may be an option for her if her aortic stenosis becomes severe and symptomatic.  Peripheral vascular disease  - To quote Dr. Darlin Priestly note from 2016: Noninvasive lower extremity studies reveal biphasic femoral waveforms and monophasic distal waveforms bilaterally. She does have highly reduced ankle arm index at 0.8 on the right and 0.88 on the left. Duplex imaging reveals occlusion of her short segment and her superficial femoral artery bilaterally.  Of course, she will let me know if any symptoms occur sooner rather than later.   Medication Adjustments/Labs and Tests Ordered: Current medicines are reviewed at length with the patient today.  Concerns regarding medicines are outlined above.  Orders Placed This Encounter  Procedures  . EKG 12-Lead  . ECHOCARDIOGRAM COMPLETE   No orders of the defined types were placed in this encounter.   Signed, Candee Furbish, MD  05/30/2017 3:38 PM    [place]

## 2017-06-03 ENCOUNTER — Ambulatory Visit
Admission: RE | Admit: 2017-06-03 | Discharge: 2017-06-03 | Disposition: A | Discharge status: Home / Self Care | Payer: PPO | Source: Ambulatory Visit | Attending: Geriatric Medicine | Admitting: Geriatric Medicine

## 2017-06-03 DIAGNOSIS — E042 Nontoxic multinodular goiter: Secondary | ICD-10-CM | POA: Diagnosis not present

## 2017-06-03 DIAGNOSIS — E041 Nontoxic single thyroid nodule: Secondary | ICD-10-CM

## 2017-07-04 DIAGNOSIS — D1801 Hemangioma of skin and subcutaneous tissue: Secondary | ICD-10-CM | POA: Diagnosis not present

## 2017-07-04 DIAGNOSIS — C44719 Basal cell carcinoma of skin of left lower limb, including hip: Secondary | ICD-10-CM | POA: Diagnosis not present

## 2017-07-04 DIAGNOSIS — Z85828 Personal history of other malignant neoplasm of skin: Secondary | ICD-10-CM | POA: Diagnosis not present

## 2017-07-04 DIAGNOSIS — L821 Other seborrheic keratosis: Secondary | ICD-10-CM | POA: Diagnosis not present

## 2017-07-04 DIAGNOSIS — D485 Neoplasm of uncertain behavior of skin: Secondary | ICD-10-CM | POA: Diagnosis not present

## 2017-07-12 DIAGNOSIS — Z85828 Personal history of other malignant neoplasm of skin: Secondary | ICD-10-CM | POA: Diagnosis not present

## 2017-07-12 DIAGNOSIS — L0889 Other specified local infections of the skin and subcutaneous tissue: Secondary | ICD-10-CM | POA: Diagnosis not present

## 2017-08-10 DIAGNOSIS — Z23 Encounter for immunization: Secondary | ICD-10-CM | POA: Diagnosis not present

## 2017-08-10 DIAGNOSIS — E441 Mild protein-calorie malnutrition: Secondary | ICD-10-CM | POA: Diagnosis not present

## 2017-08-10 DIAGNOSIS — I10 Essential (primary) hypertension: Secondary | ICD-10-CM | POA: Diagnosis not present

## 2017-08-10 DIAGNOSIS — I350 Nonrheumatic aortic (valve) stenosis: Secondary | ICD-10-CM | POA: Diagnosis not present

## 2017-08-10 DIAGNOSIS — I519 Heart disease, unspecified: Secondary | ICD-10-CM | POA: Diagnosis not present

## 2017-08-22 DIAGNOSIS — D23122 Other benign neoplasm of skin of left lower eyelid, including canthus: Secondary | ICD-10-CM | POA: Diagnosis not present

## 2017-08-22 DIAGNOSIS — D23121 Other benign neoplasm of skin of left upper eyelid, including canthus: Secondary | ICD-10-CM | POA: Diagnosis not present

## 2017-08-22 DIAGNOSIS — D23112 Other benign neoplasm of skin of right lower eyelid, including canthus: Secondary | ICD-10-CM | POA: Diagnosis not present

## 2017-08-30 DIAGNOSIS — J209 Acute bronchitis, unspecified: Secondary | ICD-10-CM | POA: Diagnosis not present

## 2017-09-21 DIAGNOSIS — H0100A Unspecified blepharitis right eye, upper and lower eyelids: Secondary | ICD-10-CM | POA: Diagnosis not present

## 2017-09-21 DIAGNOSIS — H0015 Chalazion left lower eyelid: Secondary | ICD-10-CM | POA: Diagnosis not present

## 2017-09-21 DIAGNOSIS — H0100B Unspecified blepharitis left eye, upper and lower eyelids: Secondary | ICD-10-CM | POA: Diagnosis not present

## 2017-10-06 DIAGNOSIS — Z85828 Personal history of other malignant neoplasm of skin: Secondary | ICD-10-CM | POA: Diagnosis not present

## 2017-10-06 DIAGNOSIS — L821 Other seborrheic keratosis: Secondary | ICD-10-CM | POA: Diagnosis not present

## 2017-11-07 DIAGNOSIS — Z85828 Personal history of other malignant neoplasm of skin: Secondary | ICD-10-CM | POA: Diagnosis not present

## 2017-11-07 DIAGNOSIS — L308 Other specified dermatitis: Secondary | ICD-10-CM | POA: Diagnosis not present

## 2017-12-06 DIAGNOSIS — J449 Chronic obstructive pulmonary disease, unspecified: Secondary | ICD-10-CM | POA: Diagnosis not present

## 2017-12-06 DIAGNOSIS — N183 Chronic kidney disease, stage 3 (moderate): Secondary | ICD-10-CM | POA: Diagnosis not present

## 2017-12-06 DIAGNOSIS — J3089 Other allergic rhinitis: Secondary | ICD-10-CM | POA: Diagnosis not present

## 2017-12-06 DIAGNOSIS — I350 Nonrheumatic aortic (valve) stenosis: Secondary | ICD-10-CM | POA: Diagnosis not present

## 2017-12-06 DIAGNOSIS — I129 Hypertensive chronic kidney disease with stage 1 through stage 4 chronic kidney disease, or unspecified chronic kidney disease: Secondary | ICD-10-CM | POA: Diagnosis not present

## 2017-12-12 DIAGNOSIS — H903 Sensorineural hearing loss, bilateral: Secondary | ICD-10-CM | POA: Diagnosis not present

## 2018-01-31 DIAGNOSIS — J441 Chronic obstructive pulmonary disease with (acute) exacerbation: Secondary | ICD-10-CM | POA: Diagnosis not present

## 2018-01-31 DIAGNOSIS — N183 Chronic kidney disease, stage 3 (moderate): Secondary | ICD-10-CM | POA: Diagnosis not present

## 2018-01-31 DIAGNOSIS — E441 Mild protein-calorie malnutrition: Secondary | ICD-10-CM | POA: Diagnosis not present

## 2018-01-31 DIAGNOSIS — R05 Cough: Secondary | ICD-10-CM | POA: Diagnosis not present

## 2018-01-31 DIAGNOSIS — M25512 Pain in left shoulder: Secondary | ICD-10-CM | POA: Diagnosis not present

## 2018-01-31 DIAGNOSIS — I129 Hypertensive chronic kidney disease with stage 1 through stage 4 chronic kidney disease, or unspecified chronic kidney disease: Secondary | ICD-10-CM | POA: Diagnosis not present

## 2018-04-13 DIAGNOSIS — Z85828 Personal history of other malignant neoplasm of skin: Secondary | ICD-10-CM | POA: Diagnosis not present

## 2018-04-13 DIAGNOSIS — D692 Other nonthrombocytopenic purpura: Secondary | ICD-10-CM | POA: Diagnosis not present

## 2018-04-13 DIAGNOSIS — B353 Tinea pedis: Secondary | ICD-10-CM | POA: Diagnosis not present

## 2018-04-13 DIAGNOSIS — D1801 Hemangioma of skin and subcutaneous tissue: Secondary | ICD-10-CM | POA: Diagnosis not present

## 2018-04-13 DIAGNOSIS — L821 Other seborrheic keratosis: Secondary | ICD-10-CM | POA: Diagnosis not present

## 2018-05-09 ENCOUNTER — Other Ambulatory Visit (HOSPITAL_COMMUNITY): Payer: PPO

## 2018-05-11 ENCOUNTER — Telehealth: Payer: Self-pay | Admitting: Cardiology

## 2018-05-11 NOTE — Telephone Encounter (Signed)
There is no documented information to return a phone call to the insurance company.  Per the appointment note for the echo, there is no prior authorization required.

## 2018-05-11 NOTE — Telephone Encounter (Signed)
New Message:     Valerie Villa is calling in reference to needing some diagnosis codes/cpt codes for this pt's upcoming echo.

## 2018-05-16 ENCOUNTER — Other Ambulatory Visit: Payer: Self-pay

## 2018-05-16 ENCOUNTER — Ambulatory Visit (HOSPITAL_COMMUNITY): Payer: PPO | Attending: Cardiology

## 2018-05-16 DIAGNOSIS — I350 Nonrheumatic aortic (valve) stenosis: Secondary | ICD-10-CM

## 2018-05-16 DIAGNOSIS — I080 Rheumatic disorders of both mitral and aortic valves: Secondary | ICD-10-CM | POA: Diagnosis not present

## 2018-05-16 DIAGNOSIS — I10 Essential (primary) hypertension: Secondary | ICD-10-CM | POA: Diagnosis not present

## 2018-05-17 ENCOUNTER — Telehealth: Payer: Self-pay

## 2018-05-17 NOTE — Telephone Encounter (Signed)
Notes recorded by Frederik Schmidt, RN on 05/17/2018 at 9:03 AM EDT Informed patient of ECHO results. She verbalized understanding and looks forward to nearing appt to further discuss. ------

## 2018-05-17 NOTE — Telephone Encounter (Signed)
-----   Message from Jerline Pain, MD sent at 05/17/2018  6:49 AM EDT ----- Aortic stenosis is mild. Continue with clinical observation. Candee Furbish, MD

## 2018-05-22 ENCOUNTER — Ambulatory Visit
Admission: RE | Admit: 2018-05-22 | Discharge: 2018-05-22 | Disposition: A | Discharge status: Home / Self Care | Payer: PPO | Source: Ambulatory Visit | Attending: Geriatric Medicine | Admitting: Geriatric Medicine

## 2018-05-22 ENCOUNTER — Other Ambulatory Visit: Payer: Self-pay | Admitting: Geriatric Medicine

## 2018-05-22 DIAGNOSIS — I739 Peripheral vascular disease, unspecified: Secondary | ICD-10-CM | POA: Diagnosis not present

## 2018-05-22 DIAGNOSIS — I519 Heart disease, unspecified: Secondary | ICD-10-CM | POA: Diagnosis not present

## 2018-05-22 DIAGNOSIS — Z1389 Encounter for screening for other disorder: Secondary | ICD-10-CM | POA: Diagnosis not present

## 2018-05-22 DIAGNOSIS — Q253 Supravalvular aortic stenosis: Secondary | ICD-10-CM | POA: Diagnosis not present

## 2018-05-22 DIAGNOSIS — J449 Chronic obstructive pulmonary disease, unspecified: Secondary | ICD-10-CM

## 2018-05-22 DIAGNOSIS — Z79899 Other long term (current) drug therapy: Secondary | ICD-10-CM | POA: Diagnosis not present

## 2018-05-22 DIAGNOSIS — E441 Mild protein-calorie malnutrition: Secondary | ICD-10-CM | POA: Diagnosis not present

## 2018-05-22 DIAGNOSIS — E7800 Pure hypercholesterolemia, unspecified: Secondary | ICD-10-CM | POA: Diagnosis not present

## 2018-05-22 DIAGNOSIS — Z0000 Encounter for general adult medical examination without abnormal findings: Secondary | ICD-10-CM | POA: Diagnosis not present

## 2018-05-22 DIAGNOSIS — I129 Hypertensive chronic kidney disease with stage 1 through stage 4 chronic kidney disease, or unspecified chronic kidney disease: Secondary | ICD-10-CM | POA: Diagnosis not present

## 2018-05-22 DIAGNOSIS — R05 Cough: Secondary | ICD-10-CM | POA: Diagnosis not present

## 2018-05-22 DIAGNOSIS — N183 Chronic kidney disease, stage 3 (moderate): Secondary | ICD-10-CM | POA: Diagnosis not present

## 2018-05-23 ENCOUNTER — Other Ambulatory Visit: Payer: Self-pay | Admitting: Geriatric Medicine

## 2018-05-23 ENCOUNTER — Ambulatory Visit
Admission: RE | Admit: 2018-05-23 | Discharge: 2018-05-23 | Disposition: A | Discharge status: Home / Self Care | Payer: PPO | Source: Ambulatory Visit | Attending: Geriatric Medicine | Admitting: Geriatric Medicine

## 2018-05-23 DIAGNOSIS — R918 Other nonspecific abnormal finding of lung field: Secondary | ICD-10-CM | POA: Diagnosis not present

## 2018-05-23 MED ORDER — IOPAMIDOL (ISOVUE-300) INJECTION 61%
75.0000 mL | Freq: Once | INTRAVENOUS | Status: AC | PRN
  Administered 2018-05-23: 75 mL via INTRAVENOUS

## 2018-05-25 ENCOUNTER — Ambulatory Visit: Payer: PPO | Admitting: Cardiology

## 2018-05-25 ENCOUNTER — Encounter: Payer: Self-pay | Admitting: Cardiology

## 2018-05-25 VITALS — BP 150/80 | HR 72 | Ht 64.0 in | Wt 99.4 lb

## 2018-05-25 DIAGNOSIS — I350 Nonrheumatic aortic (valve) stenosis: Secondary | ICD-10-CM

## 2018-05-25 DIAGNOSIS — I700 Atherosclerosis of aorta: Secondary | ICD-10-CM | POA: Diagnosis not present

## 2018-05-25 DIAGNOSIS — I739 Peripheral vascular disease, unspecified: Secondary | ICD-10-CM | POA: Diagnosis not present

## 2018-05-25 NOTE — Progress Notes (Signed)
Cardiology Office Note:    Date:  05/25/2018   ID:  Valerie Villa, DOB 04-Jun-1924, MRN [id number]  PCP:  Lajean Manes, MD  Cardiologist:  Candee Furbish, MD    Referring MD: Lajean Manes, MD     History of Present Illness:    Valerie Villa is a 84 y.o. female here for follow up of aortic stenosis. Echocardiogram on 05/23/17 demonstrated Aortic valve peak velocity is 2.9 m/s, mean gradient of 20 mmHg. Aortic valve area was calculated as 0.82 cm.  She goes to the gym 4 days a week, exercises well without any limitations. She has not noticed any change, no significant dyspnea on exertion, no syncope, no anginal symptoms, no bleeding.  She is very excited that she does not take any medications.  05/25/2018-overall she has been doing quite well, no chest pain fevers chills nausea vomiting syncope.  She still does not take any significant medications.  She is here today with her son.  She does state that occasionally her legs will ache her when she is walking and she needs to rub both of them at times.  Remember, she has seen Dr. Donnetta Hutching in the past.  Continue with medical management.  Her aortic valve is demonstrating mild to moderate stenosis.  Overall reassuring with no significant change.  Past Medical History:  Diagnosis Date  . Colon polyps   . Hearing impairment   . Hiatal hernia   . Hx of basal cell carcinoma   . Hypercholesteremia   . Hypertension   . Mild aortic stenosis   . Osteoporosis   . Seasonal allergies     No past surgical history on file.  Current Medications: Current Meds  Medication Sig  . Calcium Carbonate Antacid (TUMS PO) Take by mouth daily.  . cholecalciferol (VITAMIN D) 1000 UNITS tablet Take 1,000 Units by mouth daily.  . cyanocobalamin 100 MCG tablet Take 100 mcg by mouth daily.  . fluticasone (FLONASE) 50 MCG/ACT nasal spray Place 1 spray into both nostrils daily.  Marland Kitchen ketoconazole (NIZORAL) 2 % cream   . Misc Natural Products (TART CHERRY ADVANCED) CAPS  Take 1,200 capsules by mouth 3 (three) times daily.  Marland Kitchen saccharomyces boulardii (FLORASTOR) 250 MG capsule Take 250 mg by mouth daily.     Allergies:   Actonel [risedronate sodium]; Albuterol; Altace [ramipril]; Atropine; Biaxin [clarithromycin]; Ciprofloxacin; Clindamycin/lincomycin; Fosamax [alendronate sodium]; Hawthorn [crataegus oxyacantha]; Hctz [hydrochlorothiazide]; Listerine; Nasacort [triamcinolone]; Neosporin [neomycin-bacitracin zn-polymyx]; Polysporin [bacitracin-polymyxin b]; Rhinocort [budesonide]; Scope [antiseptic mouth rinse]; Simvastatin; Tetracyclines & related; Vantin [cefpodoxime]; Vioxx [rofecoxib]; and Xyntha [antihemophilic factor (recomb)]   Social History   Socioeconomic History  . Marital status: Widowed    Spouse name: Not on file  . Number of children: Not on file  . Years of education: Not on file  . Highest education level: Not on file  Occupational History  . Not on file  Social Needs  . Financial resource strain: Not on file  . Food insecurity:    Worry: Not on file    Inability: Not on file  . Transportation needs:    Medical: Not on file    Non-medical: Not on file  Tobacco Use  . Smoking status: Former Smoker    Packs/day: 0.10    Years: 5.00    Pack years: 0.50    Types: Cigarettes    Last attempt to quit: 06/05/1964    Years since quitting: 54.0  . Smokeless tobacco: Never Used  . Tobacco comment: "social smoker"  Substance and Sexual Activity  . Alcohol use: No  . Drug use: No  . Sexual activity: Not on file  Lifestyle  . Physical activity:    Days per week: Not on file    Minutes per session: Not on file  . Stress: Not on file  Relationships  . Social connections:    Talks on phone: Not on file    Gets together: Not on file    Attends religious service: Not on file    Active member of club or organization: Not on file    Attends meetings of clubs or organizations: Not on file    Relationship status: Not on file  Other Topics  Concern  . Not on file  Social History Narrative  . Not on file     Family History: The patient's family history includes Cancer in her sister; Heart disease in her father and mother.  ROS:   Please see the history of present illness.   No syncope, no anginal symptoms, no shortness of breath.  All other systems reviewed and are negative.  EKGs/Labs/Other Studies Reviewed:    The following studies were reviewed today:  ECHO 05/16/18: - Left ventricle: The cavity size was normal. Wall thickness was   normal. Systolic function was vigorous. The estimated ejection   fraction was in the range of 65% to 70%. Wall motion was normal;   there were no regional wall motion abnormalities. Doppler   parameters are consistent with abnormal left ventricular   relaxation (grade 1 diastolic dysfunction). - Aortic valve: Cusp separation was severely reduced. There was   mild stenosis. There was trivial regurgitation. - Mitral valve: Calcified annulus. Mildly thickened leaflets .   There was mild regurgitation.  Impressions:  - Vigorous LV systolic function; mild diastolic dysfunction;   severely calficied aortic valve; mean gradient of 16 mmHg   suggests mild AS but visually appears at least moderate; trace   AI; mild MR.  CT of the chest with contrast on 05/23/2018: Cardiovascular: Atherosclerosis of thoracic aorta is noted without aneurysm or dissection. Aortic valve calcifications are noted concerning for aortic valve stenosis. Normal cardiac size. No pericardial effusion.  EKG:  EKG is  ordered today.  The ekg ordered today demonstrates 05/25/2018-normal sinus rhythm 72, biatrial enlargement, T wave inversion in inferior leads personally viewed-prior sinus rhythm 78 with nonspecific ST-T wave changes vertical axis.  Recent Labs: No results found for requested labs within last 8760 hours.  Recent Lipid Panel No results found for: CHOL, TRIG, HDL, CHOLHDL, VLDL, LDLCALC, LDLDIRECT    LDL 144, HDL 76, hemoglobin 14.6, creatinine 0.9, ALT 14, TSH 2.4  Physical Exam:    VS:  BP (!) 150/80   Pulse 72   Ht 5\' 4"  (1.626 m)   Wt 99 lb 6.4 oz (45.1 kg)   BMI 17.06 kg/m     Wt Readings from Last 3 Encounters:  05/25/18 99 lb 6.4 oz (45.1 kg)  05/30/17 104 lb (47.2 kg)  08/12/15 106 lb (48.1 kg)     GEN: Thin in no acute distress  HEENT: normal  Neck: no JVD, carotid bruits, or masses Cardiac: RRR; 2/6 SM, no rubs, or gallops,no edema  Respiratory:  clear to auscultation bilaterally, normal work of breathing GI: soft, nontender, nondistended, + BS MS: no deformity or atrophy  Skin: warm and dry, no rash Neuro:  Alert and Oriented x 3, Strength and sensation are intact Psych: euthymic mood, full affect   ASSESSMENT:  1. Nonrheumatic aortic valve stenosis   2. PVD (peripheral vascular disease) ([place])    PLAN:    In order of problems listed above:  Aortic stenosis  - Still remains in the mild to moderate category based upon valve gradient/velocity. We will repeat echocardiogram in one year. She does not require aortic valve replacement at this time although given her advanced age, TAVR may be an option for her if her aortic stenosis becomes severe and symptomatic.  Is doing very well.  No changes made.  Peripheral vascular disease  - To quote Dr. Darlin Priestly note from 2016: Noninvasive lower extremity studies reveal biphasic femoral waveforms and monophasic distal waveforms bilaterally. She does have highly reduced ankle arm index at 0.8 on the right and 0.88 on the left. Duplex imaging reveals occlusion of her short segment and her superficial femoral artery bilaterally.  Aortic atherosclerosis  -Continue with prevention efforts.  Elevated blood pressure -On repeat was normotensive.  Of course, she will let me know if any symptoms occur sooner rather than later.    Medication Adjustments/Labs and Tests Ordered: Current medicines are reviewed at  length with the patient today.  Concerns regarding medicines are outlined above.  Orders Placed This Encounter  Procedures  . EKG 12-Lead   No orders of the defined types were placed in this encounter.   Signed, Candee Furbish, MD  05/25/2018 2:58 PM    [place]

## 2018-05-25 NOTE — Patient Instructions (Signed)

## 2018-06-23 ENCOUNTER — Ambulatory Visit (INDEPENDENT_AMBULATORY_CARE_PROVIDER_SITE_OTHER): Payer: PPO | Admitting: Pulmonary Disease

## 2018-06-23 ENCOUNTER — Other Ambulatory Visit: Payer: PPO

## 2018-06-23 ENCOUNTER — Encounter: Payer: Self-pay | Admitting: Pulmonary Disease

## 2018-06-23 VITALS — BP 118/64 | HR 86 | Ht 63.5 in | Wt 97.8 lb

## 2018-06-23 DIAGNOSIS — J441 Chronic obstructive pulmonary disease with (acute) exacerbation: Secondary | ICD-10-CM

## 2018-06-23 DIAGNOSIS — R911 Solitary pulmonary nodule: Secondary | ICD-10-CM

## 2018-06-23 DIAGNOSIS — R918 Other nonspecific abnormal finding of lung field: Secondary | ICD-10-CM

## 2018-06-23 MED ORDER — FLUTICASONE FUROATE-VILANTEROL 100-25 MCG/INH IN AEPB: 1.0000 | INHALATION_SPRAY | Freq: Every day | RESPIRATORY_TRACT | 0 refills | Status: DC

## 2018-06-23 NOTE — Progress Notes (Signed)
Synopsis: Referred in September 2019 for Multiple pulmonary nodules and chronic cough; She was first seen for COPD in 2015 and had minimal symptoms.  She smoked very briefly as a teenager.  No family history of lung problems.  She had a lot of second hand smoke exposure from her husband for many years, he quit smoking when [place] won the [employer] in 1963.   Subjective:   PATIENT ID: Valerie Villa GENDER: female DOB: October 14, 1923, MRN: [id number]   HPI  Chief Complaint  Patient presents with  . Consult    Referred by Dr. Felipa Eth due to chronic cough. Pt has SOB when she exerts herself. States she has had the cough which she states she has had a cough with white mucus that has been unbearable x5 months. Pt coughs a lot in the mornings and will become choked from the mucus.   Zen is here to see me for a cough: > she says that Dr. Felipa Eth and he sent her here for the same > she says that the cough has been worse since April after a URI, not diagnosed with influenza, she was prescribed something that made her more short of breath > it has been fairly severe and she has had significant coughing spells which will actually make her unstable on her feet > she feels that the cough his getting better > she is not coughing up anything > she says that she sometimes notice a "silvery white goo" that "takes up everything" > she isn't sure if this comes from her sinuses > she and her daughter say that she will produce a lot of thick mucus in the mornings; one morning she really struggled to breathe and she had to use a spoon to clear  She has lost a little weight in the last year, maybe 5 pounds.  No fevers or chills or night sweats.    Dyspnea: > she notes some at night and will feel dyspneic when she wakes up  > she does not walk much because of severe leg claudication    Past Medical History:  Diagnosis Date  . Colon polyps   . Hearing impairment   . Hiatal hernia   . Hx of basal cell  carcinoma   . Hypercholesteremia   . Hypertension   . Mild aortic stenosis   . Osteoporosis   . Seasonal allergies       Review of Systems  Constitutional: Positive for weight loss. Negative for chills, fever and malaise/fatigue.  HENT: Positive for congestion. Negative for nosebleeds, sinus pain and sore throat.   Eyes: Positive for redness. Negative for photophobia, pain and discharge.  Respiratory: Positive for cough and shortness of breath. Negative for hemoptysis, sputum production and wheezing.   Cardiovascular: Negative for chest pain, palpitations, orthopnea and leg swelling.  Gastrointestinal: Negative for abdominal pain, constipation, diarrhea, nausea and vomiting.  Genitourinary: Negative for dysuria, frequency, hematuria and urgency.  Musculoskeletal: Negative for back pain, joint pain, myalgias and neck pain.  Skin: Negative for itching and rash.  Neurological: Negative for tingling, tremors, sensory change, speech change, focal weakness, seizures, weakness and headaches.  Endo/Heme/Allergies: Bruises/bleeds easily.  Psychiatric/Behavioral: Negative for memory loss, substance abuse and suicidal ideas. The patient is not nervous/anxious.       Objective:  Physical Exam   Vitals:   06/23/18 1021  BP: 118/64  Pulse: 86  SpO2: 98%  Weight: 97 lb 12.8 oz (44.4 kg)  Height: 5' 3.5" (1.613 m)  Gen: chronically ill appearing, no acute distress HENT: NCAT, OP clear, neck supple without masses Eyes: PERRL, EOMi Lymph: no cervical lymphadenopathy PULM: CTA B CV: RRR, notable systolic murmur RUSB, no JVD GI: BS+, soft, nontender, no hsm Derm: no rash or skin breakdown MSK: normal bulk and tone Neuro: A&Ox4, CN II-XII intact, strength 5/5 in all 4 extremities Psyche: normal mood and affect   CBC No results found for: WBC, RBC, HGB, HCT, PLT, MCV, MCH, MCHC, RDW, LYMPHSABS, MONOABS, EOSABS, BASOSABS   Chest imaging: 2014 CXR > emphysema bilaterally 05/2018 CT  chest> images independently reviewed: 10.2x7.5nodule in the RLL, multiple scattered clusters of nodules bilaterally, some tree-in-bud abnormality in the RML and RLL; lingular atelectasis, several areas of diffuse airway thickening and mucus plugging  PFT: 05/2014 Spirometry > Ratio 67%, FEV1 0.9L (65% pred), clear airflow obstruction  Labs:  Path:  Echo: 05/2018 TTE> LVEF 65-70%, mild Aortic stenosis, trace AI, mild MR  Heart Catheterization:   Records from her visit with cardiology last month reviewed where she was seen for mild aortic stenosis.    Assessment & Plan:   COPD with acute exacerbation ([place]) - Plan: IgE, Fungus Culture with Smear,  MYCOBACTERIA, CULTURE, WITH FLUOROCHROME SMEAR, Respiratory or Resp and Sputum Culture, Alpha-1 antitrypsin phenotype, CANCELED: Alpha-1 antitrypsin phenotype  Abnormal findings on diagnostic imaging of lung - Plan: CANCELED: CT Chest Wo Contrast  Solitary pulmonary nodule - Plan: CT Chest Wo Contrast  Discussion: Kahan first saw me in August 2015 after an episode of bronchitis.  At that point we diagnosed her with COPD which is presumably due to heavy secondhand smoke exposure as a young adult and in early smoking history (teenage years only).  She returns to clinic today with complaints that are consistent with COPD and that she has chronic bronchitis symptoms with mucus production in the mornings.  This occurred after another round of bronchitis earlier this year.  It is not clear to me why she has COPD considering her minimal smoking history, I suppose that heavy secondhand smoke could have caused this.  She needs to be tested for alpha-1 antitrypsin deficiency.  The CT scan of her chest shows multiple scattered pulmonary nodules, the largest of which is in the right lower lobe.  The entire spectrum of findings from the CT chest is highly suggestive of an atypical infection from something like a fungus or AFB organism.  Her clinical symptoms  could be due to that as well.  However, considering the fact that the nodule is over a centimeter in size it needs to be monitored closely so we will repeat a CT in November.  Plan: Pulmonary nodule: We will repeat a CT scan of the chest in November and then see you after that  Abnormal CT chest: As I described today I am concerned that the findings from the CT chest are indicative of an infection from something like a mold or a mycobacterial organism. We will test her mucus today for these organisms, the culture result will take up to 6 weeks to come back We will also check a blood test today called a serum IgE as this can be elevated in individuals who have a fungal organism in their lungs  COPD: Because you now have symptoms from this condition I am going to recommend that she take Breo 1 puff daily, please call me if you think that this is helpful and then I can call in a prescription Get a high-dose flu shot when available  Practice good hand hygiene Stay active  We will see you back in 6-8 weeks after the CT scan has been performed   Current Outpatient Medications:  .  Calcium Carbonate Antacid (TUMS PO), Take by mouth daily., Disp: , Rfl:  .  cholecalciferol (VITAMIN D) 1000 UNITS tablet, Take 1,000 Units by mouth daily., Disp: , Rfl:  .  cyanocobalamin 100 MCG tablet, Take 100 mcg by mouth daily., Disp: , Rfl:  .  fluticasone (FLONASE) 50 MCG/ACT nasal spray, Place 1 spray into both nostrils daily., Disp: , Rfl:  .  ketoconazole (NIZORAL) 2 % cream, , Disp: , Rfl:  .  Misc Natural Products (TART CHERRY ADVANCED) CAPS, Take 1,200 capsules by mouth 3 (three) times daily., Disp: , Rfl:  .  saccharomyces boulardii (FLORASTOR) 250 MG capsule, Take 250 mg by mouth daily., Disp: , Rfl:  .  fluticasone furoate-vilanterol (BREO ELLIPTA) 100-25 MCG/INH AEPB, Inhale 1 puff into the lungs daily., Disp: 1 each, Rfl: 0

## 2018-06-23 NOTE — Patient Instructions (Signed)
Pulmonary nodule: We will repeat a CT scan of the chest in November and then see you after that  Abnormal CT chest: As I described today I am concerned that the findings from the CT chest are indicative of an infection from something like a mold or a mycobacterial organism. We will test her mucus today for these organisms, the culture result will take up to 6 weeks to come back We will also check a blood test today called a serum IgE as this can be elevated in individuals who have a fungal organism in their lungs  COPD: Because you now have symptoms from this condition I am going to recommend that she take Breo 1 puff daily, please call me if you think that this is helpful and then I can call in a prescription Get a high-dose flu shot when available Practice good hand hygiene Stay active  We will see you back in 6-8 weeks after the CT scan has been performed

## 2018-06-26 ENCOUNTER — Other Ambulatory Visit: Payer: PPO

## 2018-06-26 ENCOUNTER — Telehealth: Payer: Self-pay | Admitting: Pulmonary Disease

## 2018-06-26 DIAGNOSIS — J441 Chronic obstructive pulmonary disease with (acute) exacerbation: Secondary | ICD-10-CM | POA: Diagnosis not present

## 2018-06-26 NOTE — Telephone Encounter (Signed)
Left message for patient to call back. Do not see where anyone from this office called patient.

## 2018-06-27 DIAGNOSIS — H00014 Hordeolum externum left upper eyelid: Secondary | ICD-10-CM | POA: Diagnosis not present

## 2018-06-27 DIAGNOSIS — Z23 Encounter for immunization: Secondary | ICD-10-CM | POA: Diagnosis not present

## 2018-06-27 NOTE — Telephone Encounter (Signed)
Patient already talked to bettie jo and nothing further is needed at this time.

## 2018-06-28 LAB — ALPHA-1 ANTITRYPSIN PHENOTYPE: A1 ANTITRYPSIN SER: 173 mg/dL (ref 83–199)

## 2018-06-28 LAB — IGE: IgE (Immunoglobulin E), Serum: 16 kU/L (ref <=114)

## 2018-07-26 DIAGNOSIS — I129 Hypertensive chronic kidney disease with stage 1 through stage 4 chronic kidney disease, or unspecified chronic kidney disease: Secondary | ICD-10-CM | POA: Diagnosis not present

## 2018-07-26 DIAGNOSIS — J449 Chronic obstructive pulmonary disease, unspecified: Secondary | ICD-10-CM | POA: Diagnosis not present

## 2018-07-26 DIAGNOSIS — N183 Chronic kidney disease, stage 3 (moderate): Secondary | ICD-10-CM | POA: Diagnosis not present

## 2018-07-26 DIAGNOSIS — E441 Mild protein-calorie malnutrition: Secondary | ICD-10-CM | POA: Diagnosis not present

## 2018-07-27 DIAGNOSIS — J441 Chronic obstructive pulmonary disease with (acute) exacerbation: Secondary | ICD-10-CM | POA: Diagnosis not present

## 2018-08-14 ENCOUNTER — Ambulatory Visit
Admission: RE | Admit: 2018-08-14 | Discharge: 2018-08-14 | Disposition: A | Discharge status: Home / Self Care | Payer: PPO | Source: Ambulatory Visit | Attending: Pulmonary Disease | Admitting: Pulmonary Disease

## 2018-08-14 DIAGNOSIS — R918 Other nonspecific abnormal finding of lung field: Secondary | ICD-10-CM | POA: Diagnosis not present

## 2018-08-14 DIAGNOSIS — R911 Solitary pulmonary nodule: Secondary | ICD-10-CM

## 2018-08-14 LAB — FUNGUS CULTURE W SMEAR
MICRO NUMBER:: 91139433
SMEAR:: NONE SEEN
SPECIMEN QUALITY: ADEQUATE

## 2018-08-14 LAB — MYCOBACTERIA,CULT W/FLUOROCHROME SMEAR
MICRO NUMBER: 91139434
SMEAR: NONE SEEN
SPECIMEN QUALITY:: ADEQUATE

## 2018-08-14 LAB — RESPIRATORY CULTURE OR RESPIRATORY AND SPUTUM CULTURE
MICRO NUMBER: 91139435
RESULT: NORMAL
SPECIMEN QUALITY:: ADEQUATE

## 2018-08-23 ENCOUNTER — Telehealth: Payer: Self-pay | Admitting: Pulmonary Disease

## 2018-08-23 ENCOUNTER — Ambulatory Visit: Payer: PPO | Admitting: Pulmonary Disease

## 2018-08-23 ENCOUNTER — Encounter: Payer: Self-pay | Admitting: Pulmonary Disease

## 2018-08-23 VITALS — BP 144/76 | HR 80 | Wt 97.6 lb

## 2018-08-23 DIAGNOSIS — R911 Solitary pulmonary nodule: Secondary | ICD-10-CM | POA: Diagnosis not present

## 2018-08-23 DIAGNOSIS — J339 Nasal polyp, unspecified: Secondary | ICD-10-CM | POA: Diagnosis not present

## 2018-08-23 DIAGNOSIS — J449 Chronic obstructive pulmonary disease, unspecified: Secondary | ICD-10-CM | POA: Diagnosis not present

## 2018-08-23 DIAGNOSIS — R05 Cough: Secondary | ICD-10-CM

## 2018-08-23 DIAGNOSIS — J301 Allergic rhinitis due to pollen: Secondary | ICD-10-CM | POA: Diagnosis not present

## 2018-08-23 DIAGNOSIS — R059 Cough, unspecified: Secondary | ICD-10-CM

## 2018-08-23 MED ORDER — IPRATROPIUM-ALBUTEROL 0.5-2.5 (3) MG/3ML IN SOLN: 3.0000 mL | RESPIRATORY_TRACT | 11 refills | Status: DC | PRN

## 2018-08-23 NOTE — Patient Instructions (Signed)
Allergic rhinitis: Use Neil Med rinses with distilled water at least twice per day using the instructions on the package. 1/2 hour after using the [facility] Med rinse, use Nasacort two puffs in each nostril once per day.  Remember that the Nasacort can take 1-2 weeks to work after regular use. Use generic Allergra every day for a month.  If this doesn't help, then stop taking it and use chlorpheniramine-phenylephrine combination tablets.  Cough: You need to try to suppress your cough to allow your larynx (voice box) to heal.  For three days don't talk, laugh, sing, or clear your throat. Do everything you can to suppress the cough during this time. Use hard candies (sugarless Jolly Ranchers) or non-mint or non-menthol containing cough drops during this time to soothe your throat.  Use a cough suppressant (Delsym or what I have prescribed you) around the clock during this time.  After three days, gradually increase the use of your voice and back off on the cough suppressants.  COPD: This is mild, and unlikely to be the cause of your cough I do not feel that she need to take controller medicines like Breo, or Anoro Use DuoNeb every 4-6 hours as needed for the cough or shortness of breath  Abnormal CT scan of the chest: This showed mucous plugging and small pulmonary nodules which normally would be indicative of a chronic infection.  However, fortunately for you you do not have any signs or symptoms of a respiratory infection.  We will see you back in 4 weeks or sooner if needed

## 2018-08-23 NOTE — Telephone Encounter (Signed)
LMTCB

## 2018-08-23 NOTE — Progress Notes (Signed)
Synopsis: Referred in September 2019 for Multiple pulmonary nodules and chronic cough; She was first seen for COPD in 2015 and had minimal symptoms.  She smoked very briefly as a teenager.  No family history of lung problems.  She had a lot of second hand smoke exposure from her husband for many years, he quit smoking when [place] won the [employer] in 1963.   Subjective:   PATIENT ID: Valerie Villa GENDER: female DOB: 01/10/1924, MRN: [id number]   HPI  Chief Complaint  Patient presents with  . Follow-up    continues to have cough   She didn't sleep much last night due to coughing. She feels like it is coming from her throat but she doesn't really feel chest congestion.  She recently had a sore throat but she  hasn't had bronchitis since the last visit.   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  She says that she has a near continuous post nasal drip.  She stopped taking flonase a few weeks ago because she said it made her nose sore.    She says she doesn't like taking medicines.  She does not have problems with shortness of breath or wheezing.  Past Medical History:  Diagnosis Date  . Colon polyps   . Hearing impairment   . Hiatal hernia   . Hx of basal cell carcinoma   . Hypercholesteremia   . Hypertension   . Mild aortic stenosis   . Osteoporosis   . Seasonal allergies       Review of Systems  Constitutional: Positive for weight loss. Negative for chills, fever and malaise/fatigue.  HENT: Positive for congestion. Negative for nosebleeds, sinus pain and sore throat.   Eyes: Positive for redness. Negative for photophobia, pain and discharge.  Respiratory: Positive for cough and shortness of breath. Negative for hemoptysis, sputum production and wheezing.   Cardiovascular: Negative for chest pain, palpitations, orthopnea and leg swelling.  Gastrointestinal: Negative for abdominal pain, constipation,  diarrhea, nausea and vomiting.  Genitourinary: Negative for dysuria, frequency, hematuria and urgency.  Musculoskeletal: Negative for back pain, joint pain, myalgias and neck pain.  Skin: Negative for itching and rash.  Neurological: Negative for tingling, tremors, sensory change, speech change, focal weakness, seizures, weakness and headaches.  Endo/Heme/Allergies: Bruises/bleeds easily.  Psychiatric/Behavioral: Negative for memory loss, substance abuse and suicidal ideas. The patient is not nervous/anxious.       Objective:  Physical Exam   Vitals:   08/23/18 1030  BP: (!) 144/76  Pulse: 80  SpO2: 96%  Weight: 97 lb 9.6 oz (44.3 kg)    Gen: well appearing HENT: OP clear, TM's clear, neck supple PULM: CTA B, normal percussion CV: RRR, no mgr, trace edema GI: BS+, soft, nontender Derm: no cyanosis or rash Psyche: normal mood and affect   CBC No results found for: WBC, RBC, HGB, HCT, PLT, MCV, MCH, MCHC, RDW, LYMPHSABS, MONOABS, EOSABS, BASOSABS   Chest imaging: 2014 CXR > emphysema bilaterally 05/2018 CT chest> images independently reviewed: 10.2x7.5nodule in the RLL, multiple scattered clusters of nodules bilaterally, some tree-in-bud abnormality in the RML and RLL; lingular atelectasis, several areas of diffuse airway thickening and mucus plugging 08/2018 CT chest > images independently reviewed showing multiple scattered nodules, the largest of which is 8 mm, unchanged compared to prior, there is no bronchiectasis, there is tree-in-bud abnormalities particularly in the right middle lobe and the lingula, no emphysema  PFT: 05/2014 Spirometry > Ratio 67%, FEV1 0.9L (65% pred), clear airflow obstruction  Labs: September 2019 alpha-1 antitrypsin genotype M-M  Path:  Echo: 05/2018 TTE> LVEF 65-70%, mild Aortic stenosis, trace AI, mild MR  Heart Catheterization:  Micro: September 2019 sputum culture negative September 2019 sputum fungus culture negative September  2019 sputum AFB culture negative       Assessment & Plan:   COPD, mild ([place]) - Plan: Ambulatory Referral for DME  Solitary pulmonary nodule  Nasal polyp  Cough  Allergic rhinitis due to pollen, unspecified seasonality  Discussion: And returns to clinic complaining of severe, bothersome cough.  Fortunately she does not have abnormal vital signs, and abnormal physical exam, or other findings of a severe underlying lung problem.  She does have COPD which I think is due to secondhand smoke exposure or she is not being forthright with her smoking history.  That being said, when we have tried to treat this with controller medicines it is not helped her cough.  She also has small pulmonary nodules which typically are indicative of a chronic respiratory infection but fortunately her sputum culture was all within normal limits.  As she does not have fever, chills, or weight loss or other signs of systemic infection I do not think that there is value in pursuing something like a bronchoscopy because she does not have signs or symptoms of MAI infection.  Her predominant problem is cough in the setting of ongoing postnasal drip for which she is doing nothing.  I think that the cough is due to allergic rhinitis which she is not treating and ongoing laryngeal irritation.  She has cyclical cough due to laryngeal irritation.   If we can make her sinus congestion dry up and she follows voice rest yet still has problems with cough then we can try to treat her mild COPD and mucus plugging more aggressively with inhaled nebulized controller medicines and hypertonic saline.  Plan: Allergic rhinitis: Use Neil Med rinses with distilled water at least twice per day using the instructions on the package. 1/2 hour after using the [facility] Med rinse, use Nasacort two puffs in each nostril once per day.  Remember that the Nasacort can take 1-2 weeks to work after regular use. Use generic Allergra every day for a month.   If this doesn't help, then stop taking it and use chlorpheniramine-phenylephrine combination tablets.  Cough: You need to try to suppress your cough to allow your larynx (voice box) to heal.  For three days don't talk, laugh, sing, or clear your throat. Do everything you can to suppress the cough during this time. Use hard candies (sugarless Jolly Ranchers) or non-mint or non-menthol containing cough drops during this time to soothe your throat.  Use a cough suppressant (Delsym or what I have prescribed you) around the clock during this time.  After three days, gradually increase the use of your voice and back off on the cough suppressants.  COPD: This is mild, and unlikely to be the cause of your cough I do not feel that she need to take controller medicines like Breo, or Anoro Use DuoNeb every 4-6 hours as needed for the cough or shortness of breath  Abnormal CT scan of the chest: This showed mucous plugging and small pulmonary nodules which normally would be indicative of a chronic infection.  However, fortunately for you you do not have any signs or symptoms of a respiratory infection.  We will see you back in 4 weeks  or sooner if needed  Greater than 50% of this 35-minute visit spent face to face   Current Outpatient Medications:  .  Calcium Carbonate Antacid (TUMS PO), Take by mouth daily., Disp: , Rfl:  .  cholecalciferol (VITAMIN D) 1000 UNITS tablet, Take 1,000 Units by mouth daily., Disp: , Rfl:  .  cyanocobalamin 100 MCG tablet, Take 100 mcg by mouth daily., Disp: , Rfl:  .  fluticasone (FLONASE) 50 MCG/ACT nasal spray, Place 1 spray into both nostrils daily., Disp: , Rfl:  .  fluticasone furoate-vilanterol (BREO ELLIPTA) 100-25 MCG/INH AEPB, Inhale 1 puff into the lungs daily., Disp: 1 each, Rfl: 0 .  ketoconazole (NIZORAL) 2 % cream, , Disp: , Rfl:  .  Misc Natural Products (TART CHERRY ADVANCED) CAPS, Take 1,200 capsules by mouth 3 (three) times daily., Disp: , Rfl:  .   saccharomyces boulardii (FLORASTOR) 250 MG capsule, Take 250 mg by mouth daily., Disp: , Rfl:

## 2018-08-24 NOTE — Telephone Encounter (Signed)
Spoke with pt. She is aware of BQ's response. Nothing further was needed at this time.

## 2018-08-24 NOTE — Telephone Encounter (Signed)
Called and spoke with pt. Pt wanted to know if BQ had an update for her from Dr. Felipa Eth. Pt stated she thought that BQ was going to call Dr. Felipa Eth and discuss Yupelri neb sol for pt. Pt was seen at office yesterday, 08/23/18 by BQ.  Dr. Lake Bells, please advise on this for pt. Thanks!

## 2018-08-24 NOTE — Telephone Encounter (Signed)
No, I explained yesterday that she needs to just use duoneb as needed, nothing else

## 2018-08-27 DIAGNOSIS — J441 Chronic obstructive pulmonary disease with (acute) exacerbation: Secondary | ICD-10-CM | POA: Diagnosis not present

## 2018-08-28 ENCOUNTER — Telehealth: Payer: Self-pay | Admitting: Pulmonary Disease

## 2018-08-28 NOTE — Telephone Encounter (Signed)
Patient wanted to go over her AVS as she felt like it was not gone over enough with her. Went over AVS with patient. Nothing further needed.

## 2018-09-20 ENCOUNTER — Ambulatory Visit: Payer: PPO | Admitting: Pulmonary Disease

## 2018-09-20 ENCOUNTER — Encounter: Payer: Self-pay | Admitting: Pulmonary Disease

## 2018-09-20 ENCOUNTER — Ambulatory Visit (INDEPENDENT_AMBULATORY_CARE_PROVIDER_SITE_OTHER): Payer: PPO | Admitting: Pulmonary Disease

## 2018-09-20 VITALS — BP 122/58 | HR 68 | Ht 63.5 in | Wt 98.8 lb

## 2018-09-20 DIAGNOSIS — J301 Allergic rhinitis due to pollen: Secondary | ICD-10-CM

## 2018-09-20 DIAGNOSIS — R911 Solitary pulmonary nodule: Secondary | ICD-10-CM

## 2018-09-20 DIAGNOSIS — J449 Chronic obstructive pulmonary disease, unspecified: Secondary | ICD-10-CM

## 2018-09-20 NOTE — Patient Instructions (Signed)
Cough: Try taking a lower dose of Delsym Try suppressing your cough  Allergic rhinitis: As before I continue to recommend a second-generation antihistamine like Allegra, you could try taking it every other day, you could do Flonase 1 spray each nostril daily instead of 2, and you could try using saline sprays instead of rinses  Wheezing: You do have mild COPD, I continue to recommend nebulized DuoNeb as needed  We will see you back if your symptoms worsen

## 2018-09-20 NOTE — Progress Notes (Signed)
Synopsis: Referred in September 2019 for Multiple pulmonary nodules and chronic cough; She was first seen for COPD in 2015 and had minimal symptoms.  She smoked very briefly as a teenager.  No family history of lung problems.  She had a lot of second hand smoke exposure from her husband for many years, he quit smoking when [place] won the [employer] in 1963.   Subjective:   PATIENT ID: Valerie Villa GENDER: female DOB: 1924-04-17, MRN: [id number]   HPI  Chief Complaint  Patient presents with  . Follow-up    pt states she is some better since last ov, but cough is still present. cough is mostly nonprod. also notes PND.    [name] says that she is "halfway better" > she says taht she continues to struggle with cough and mucus production in the morings but it is much better than before.  She says that she is able to sleep through the night now > She says that she still coughing up mucus in the mornigns > she still has sinus congestion on a day-to-day basis, she did not take most of the treatment that I recommended for her sinuses because she says she has "a sensitive nose". > She did not pick up the DuoNeb because she said she did not like the part of town where she needed to go get it  She denies any sort of shortness of breath though she says she will wheeze from time to time  Past Medical History:  Diagnosis Date  . Colon polyps   . Hearing impairment   . Hiatal hernia   . Hx of basal cell carcinoma   . Hypercholesteremia   . Hypertension   . Mild aortic stenosis   . Osteoporosis   . Seasonal allergies       Review of Systems  Constitutional: Positive for weight loss. Negative for chills, fever and malaise/fatigue.  HENT: Positive for congestion. Negative for nosebleeds, sinus pain and sore throat.   Eyes: Positive for redness. Negative for photophobia, pain and discharge.  Respiratory: Positive for cough and shortness of breath. Negative for hemoptysis, sputum production and  wheezing.   Cardiovascular: Negative for chest pain, palpitations, orthopnea and leg swelling.  Gastrointestinal: Negative for abdominal pain, constipation, diarrhea, nausea and vomiting.  Genitourinary: Negative for dysuria, frequency, hematuria and urgency.  Musculoskeletal: Negative for back pain, joint pain, myalgias and neck pain.  Skin: Negative for itching and rash.  Neurological: Negative for tingling, tremors, sensory change, speech change, focal weakness, seizures, weakness and headaches.  Endo/Heme/Allergies: Bruises/bleeds easily.  Psychiatric/Behavioral: Negative for memory loss, substance abuse and suicidal ideas. The patient is not nervous/anxious.       Objective:  Physical Exam   Vitals:   09/20/18 1504  BP: (!) 122/58  Pulse: 68  SpO2: 97%  Weight: 98 lb 12.8 oz (44.8 kg)  Height: 5' 3.5" (1.613 m)    Gen: no acute distress HENT: OP clear, TM's clear, neck supple PULM: Wheezing bilaterally B, normal percussion CV: RRR, no mgr, trace edema GI: BS+, soft, nontender Derm: no cyanosis or rash Psyche: normal mood and affect    CBC No results found for: WBC, RBC, HGB, HCT, PLT, MCV, MCH, MCHC, RDW, LYMPHSABS, MONOABS, EOSABS, BASOSABS   Chest imaging: 2014 CXR > emphysema bilaterally 05/2018 CT chest> images independently reviewed: 10.2x7.5nodule in the RLL, multiple scattered clusters of nodules bilaterally, some tree-in-bud abnormality in the RML and RLL; lingular atelectasis, several areas of diffuse airway thickening  and mucus plugging 08/2018 CT chest > images independently reviewed showing multiple scattered nodules, the largest of which is 8 mm, unchanged compared to prior, there is no bronchiectasis, there is tree-in-bud abnormalities particularly in the right middle lobe and the lingula, no emphysema  PFT: 05/2014 Spirometry > Ratio 67%, FEV1 0.9L (65% pred), clear airflow obstruction  Labs: September 2019 alpha-1 antitrypsin genotype  M-M  Path:  Echo: 05/2018 TTE> LVEF 65-70%, mild Aortic stenosis, trace AI, mild MR  Heart Catheterization:  Micro: September 2019 sputum culture negative September 2019 sputum fungus culture negative September 2019 sputum AFB culture negative       Assessment & Plan:   COPD, mild ([facility])  Solitary pulmonary nodule  Allergic rhinitis due to pollen, unspecified seasonality  Discussion: Ms. Hoelzer has a delightful disposition but she has no desire whatsoever to listen to anything that I have to tell her nor does she have any interest in taking any of the therapy which I have recommended.  Fortunately I cannot identify any sort of life-threatening illness and her symptom of cough seems to be getting better.  Through 3 separate visits over the last several months I have tried mightily to get her to treat her allergic rhinitis and mild COPD but she has come up with various reasons why she refuses to do this.  I really see minimal benefit to our ongoing visits at this point.  Plan: Cough: Try taking a lower dose of Delsym Try suppressing your cough  Allergic rhinitis: As before I continue to recommend a second-generation antihistamine like Allegra, you could try taking it every other day, you could do Flonase 1 spray each nostril daily instead of 2, and you could try using saline sprays instead of rinses  Wheezing: You do have mild COPD, I continue to recommend nebulized DuoNeb as needed  We will see you back if your symptoms worsen  > 50% of this 30 minute visit spent face to face  Current Outpatient Medications:  .  Calcium Carbonate Antacid (TUMS PO), Take by mouth daily., Disp: , Rfl:  .  cholecalciferol (VITAMIN D) 1000 UNITS tablet, Take 1,000 Units by mouth daily., Disp: , Rfl:  .  cyanocobalamin 100 MCG tablet, Take 100 mcg by mouth daily., Disp: , Rfl:  .  ipratropium-albuterol (DUONEB) 0.5-2.5 (3) MG/3ML SOLN, Take 3 mLs by nebulization every 4 (four) hours as needed.  Dx code: j35.9, Disp: 360 mL, Rfl: 11 .  ketoconazole (NIZORAL) 2 % cream, , Disp: , Rfl:  .  Misc Natural Products (TART CHERRY ADVANCED) CAPS, Take 1,200 capsules by mouth 3 (three) times daily., Disp: , Rfl:  .  saccharomyces boulardii (FLORASTOR) 250 MG capsule, Take 250 mg by mouth daily., Disp: , Rfl:

## 2018-09-22 DIAGNOSIS — I129 Hypertensive chronic kidney disease with stage 1 through stage 4 chronic kidney disease, or unspecified chronic kidney disease: Secondary | ICD-10-CM | POA: Diagnosis not present

## 2018-09-22 DIAGNOSIS — J449 Chronic obstructive pulmonary disease, unspecified: Secondary | ICD-10-CM | POA: Diagnosis not present

## 2018-09-22 DIAGNOSIS — N183 Chronic kidney disease, stage 3 (moderate): Secondary | ICD-10-CM | POA: Diagnosis not present

## 2018-09-26 DIAGNOSIS — J441 Chronic obstructive pulmonary disease with (acute) exacerbation: Secondary | ICD-10-CM | POA: Diagnosis not present

## 2018-10-23 DIAGNOSIS — Z85828 Personal history of other malignant neoplasm of skin: Secondary | ICD-10-CM | POA: Diagnosis not present

## 2018-10-23 DIAGNOSIS — D485 Neoplasm of uncertain behavior of skin: Secondary | ICD-10-CM | POA: Diagnosis not present

## 2018-10-23 DIAGNOSIS — C44719 Basal cell carcinoma of skin of left lower limb, including hip: Secondary | ICD-10-CM | POA: Diagnosis not present

## 2018-10-27 DIAGNOSIS — J441 Chronic obstructive pulmonary disease with (acute) exacerbation: Secondary | ICD-10-CM | POA: Diagnosis not present

## 2018-11-08 DIAGNOSIS — H353210 Exudative age-related macular degeneration, right eye, stage unspecified: Secondary | ICD-10-CM | POA: Diagnosis not present

## 2018-11-08 DIAGNOSIS — H01002 Unspecified blepharitis right lower eyelid: Secondary | ICD-10-CM | POA: Diagnosis not present

## 2018-11-08 DIAGNOSIS — H26491 Other secondary cataract, right eye: Secondary | ICD-10-CM | POA: Diagnosis not present

## 2018-11-08 DIAGNOSIS — H01005 Unspecified blepharitis left lower eyelid: Secondary | ICD-10-CM | POA: Diagnosis not present

## 2018-11-30 DIAGNOSIS — H26491 Other secondary cataract, right eye: Secondary | ICD-10-CM | POA: Diagnosis not present

## 2018-12-07 DIAGNOSIS — I872 Venous insufficiency (chronic) (peripheral): Secondary | ICD-10-CM | POA: Diagnosis not present

## 2018-12-07 DIAGNOSIS — L308 Other specified dermatitis: Secondary | ICD-10-CM | POA: Diagnosis not present

## 2018-12-07 DIAGNOSIS — I8312 Varicose veins of left lower extremity with inflammation: Secondary | ICD-10-CM | POA: Diagnosis not present

## 2018-12-07 DIAGNOSIS — Z85828 Personal history of other malignant neoplasm of skin: Secondary | ICD-10-CM | POA: Diagnosis not present

## 2018-12-07 DIAGNOSIS — I8311 Varicose veins of right lower extremity with inflammation: Secondary | ICD-10-CM | POA: Diagnosis not present

## 2018-12-07 DIAGNOSIS — L820 Inflamed seborrheic keratosis: Secondary | ICD-10-CM | POA: Diagnosis not present

## 2019-01-23 DIAGNOSIS — I129 Hypertensive chronic kidney disease with stage 1 through stage 4 chronic kidney disease, or unspecified chronic kidney disease: Secondary | ICD-10-CM | POA: Diagnosis not present

## 2019-01-23 DIAGNOSIS — J449 Chronic obstructive pulmonary disease, unspecified: Secondary | ICD-10-CM | POA: Diagnosis not present

## 2019-01-23 DIAGNOSIS — I519 Heart disease, unspecified: Secondary | ICD-10-CM | POA: Diagnosis not present

## 2019-01-23 DIAGNOSIS — N183 Chronic kidney disease, stage 3 (moderate): Secondary | ICD-10-CM | POA: Diagnosis not present

## 2019-01-25 DIAGNOSIS — Z85828 Personal history of other malignant neoplasm of skin: Secondary | ICD-10-CM | POA: Diagnosis not present

## 2019-01-25 DIAGNOSIS — I8312 Varicose veins of left lower extremity with inflammation: Secondary | ICD-10-CM | POA: Diagnosis not present

## 2019-01-25 DIAGNOSIS — I872 Venous insufficiency (chronic) (peripheral): Secondary | ICD-10-CM | POA: Diagnosis not present

## 2019-01-25 DIAGNOSIS — I8311 Varicose veins of right lower extremity with inflammation: Secondary | ICD-10-CM | POA: Diagnosis not present

## 2019-01-25 DIAGNOSIS — L308 Other specified dermatitis: Secondary | ICD-10-CM | POA: Diagnosis not present

## 2019-04-19 DIAGNOSIS — L570 Actinic keratosis: Secondary | ICD-10-CM | POA: Diagnosis not present

## 2019-04-19 DIAGNOSIS — L858 Other specified epidermal thickening: Secondary | ICD-10-CM | POA: Diagnosis not present

## 2019-04-19 DIAGNOSIS — L812 Freckles: Secondary | ICD-10-CM | POA: Diagnosis not present

## 2019-04-19 DIAGNOSIS — Z85828 Personal history of other malignant neoplasm of skin: Secondary | ICD-10-CM | POA: Diagnosis not present

## 2019-04-19 DIAGNOSIS — L821 Other seborrheic keratosis: Secondary | ICD-10-CM | POA: Diagnosis not present

## 2019-05-01 DIAGNOSIS — R197 Diarrhea, unspecified: Secondary | ICD-10-CM | POA: Diagnosis not present

## 2019-05-29 DIAGNOSIS — K219 Gastro-esophageal reflux disease without esophagitis: Secondary | ICD-10-CM | POA: Diagnosis not present

## 2019-05-29 DIAGNOSIS — Z0000 Encounter for general adult medical examination without abnormal findings: Secondary | ICD-10-CM | POA: Diagnosis not present

## 2019-05-29 DIAGNOSIS — I700 Atherosclerosis of aorta: Secondary | ICD-10-CM | POA: Diagnosis not present

## 2019-05-29 DIAGNOSIS — I739 Peripheral vascular disease, unspecified: Secondary | ICD-10-CM | POA: Diagnosis not present

## 2019-05-29 DIAGNOSIS — Z79899 Other long term (current) drug therapy: Secondary | ICD-10-CM | POA: Diagnosis not present

## 2019-05-29 DIAGNOSIS — I129 Hypertensive chronic kidney disease with stage 1 through stage 4 chronic kidney disease, or unspecified chronic kidney disease: Secondary | ICD-10-CM | POA: Diagnosis not present

## 2019-05-29 DIAGNOSIS — E7800 Pure hypercholesterolemia, unspecified: Secondary | ICD-10-CM | POA: Diagnosis not present

## 2019-05-29 DIAGNOSIS — N183 Chronic kidney disease, stage 3 (moderate): Secondary | ICD-10-CM | POA: Diagnosis not present

## 2019-05-29 DIAGNOSIS — E441 Mild protein-calorie malnutrition: Secondary | ICD-10-CM | POA: Diagnosis not present

## 2019-05-29 DIAGNOSIS — Q253 Supravalvular aortic stenosis: Secondary | ICD-10-CM | POA: Diagnosis not present

## 2019-05-29 DIAGNOSIS — Z1389 Encounter for screening for other disorder: Secondary | ICD-10-CM | POA: Diagnosis not present

## 2019-05-29 DIAGNOSIS — I519 Heart disease, unspecified: Secondary | ICD-10-CM | POA: Diagnosis not present

## 2019-07-18 DIAGNOSIS — Z23 Encounter for immunization: Secondary | ICD-10-CM | POA: Diagnosis not present

## 2019-11-06 DIAGNOSIS — L821 Other seborrheic keratosis: Secondary | ICD-10-CM | POA: Diagnosis not present

## 2019-11-06 DIAGNOSIS — Z85828 Personal history of other malignant neoplasm of skin: Secondary | ICD-10-CM | POA: Diagnosis not present

## 2019-11-06 DIAGNOSIS — L853 Xerosis cutis: Secondary | ICD-10-CM | POA: Diagnosis not present

## 2019-11-27 DIAGNOSIS — D692 Other nonthrombocytopenic purpura: Secondary | ICD-10-CM | POA: Diagnosis not present

## 2019-11-27 DIAGNOSIS — I129 Hypertensive chronic kidney disease with stage 1 through stage 4 chronic kidney disease, or unspecified chronic kidney disease: Secondary | ICD-10-CM | POA: Diagnosis not present

## 2019-11-27 DIAGNOSIS — E441 Mild protein-calorie malnutrition: Secondary | ICD-10-CM | POA: Diagnosis not present

## 2019-11-27 DIAGNOSIS — I700 Atherosclerosis of aorta: Secondary | ICD-10-CM | POA: Diagnosis not present

## 2019-11-27 DIAGNOSIS — N1831 Chronic kidney disease, stage 3a: Secondary | ICD-10-CM | POA: Diagnosis not present

## 2019-11-27 DIAGNOSIS — J441 Chronic obstructive pulmonary disease with (acute) exacerbation: Secondary | ICD-10-CM | POA: Diagnosis not present

## 2020-01-25 DIAGNOSIS — R0781 Pleurodynia: Secondary | ICD-10-CM | POA: Diagnosis not present

## 2020-01-25 DIAGNOSIS — R197 Diarrhea, unspecified: Secondary | ICD-10-CM | POA: Diagnosis not present

## 2020-01-30 ENCOUNTER — Ambulatory Visit: Payer: PPO | Admitting: Cardiology

## 2020-01-31 ENCOUNTER — Telehealth: Payer: Self-pay | Admitting: Cardiology

## 2020-01-31 NOTE — Telephone Encounter (Signed)
Curt Bears, Daughter-in-law of the patient called and wanted to know if she would be able to accompany the patient to her upcoming appointment, as well as all future appointments. The patient has a hard time hearing and needs someone else to listen in on what the Doctor has to say. Please let the DIL know what the office decides

## 2020-01-31 NOTE — Telephone Encounter (Signed)
Pts daughter to accompany her to her 02/25/20 appt..Pt is very hard of hearing and needs help with ambulation.

## 2020-02-01 ENCOUNTER — Ambulatory Visit: Payer: PPO | Admitting: Cardiology

## 2020-02-25 ENCOUNTER — Ambulatory Visit: Payer: PPO | Admitting: Cardiology

## 2020-02-26 IMAGING — CT CT CHEST W/O CM
2 of 4 series · 12 of 36 positions shown, 15 images · non-contrast
Comparison: 05/23/2018

CLINICAL DATA: Follow-up indeterminate pulmonary nodules. Chronic
cough. Former smoker.

EXAM:
CT CHEST WITHOUT CONTRAST
TECHNIQUE: Multidetector CT imaging of the chest was performed following the
standard protocol without IV contrast.

[Series 2: chest 2.00 br40 s3 ax · axial · 0.43mm/px · z∈[+1522,+1790]mm · 9 of 161 slices shown, 12 images]
[im 13/161  mediastinal]
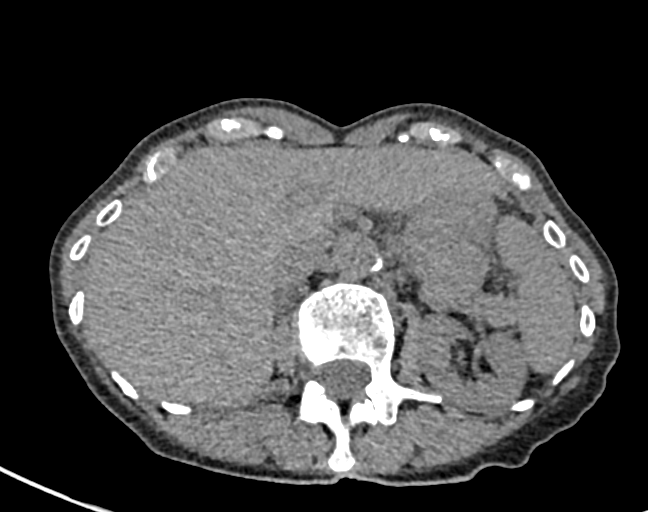
[im 13/161  lung]
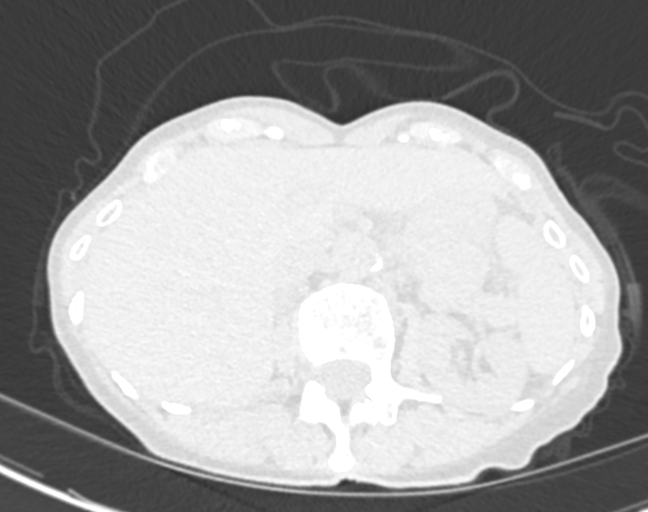
[im 37/161  lung]
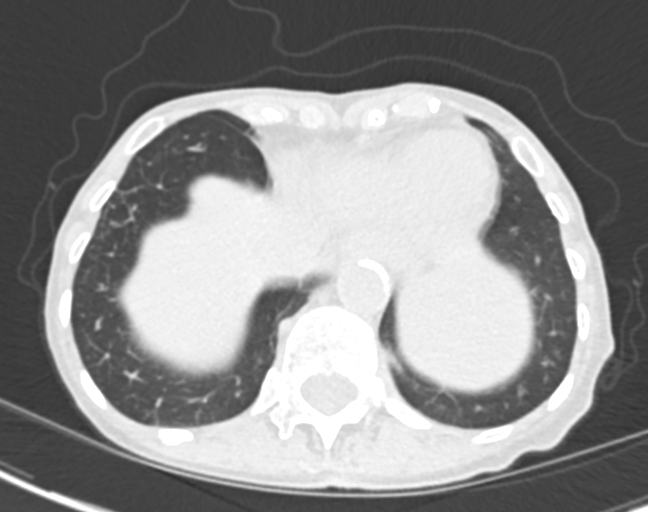
[im 50/161  lung]
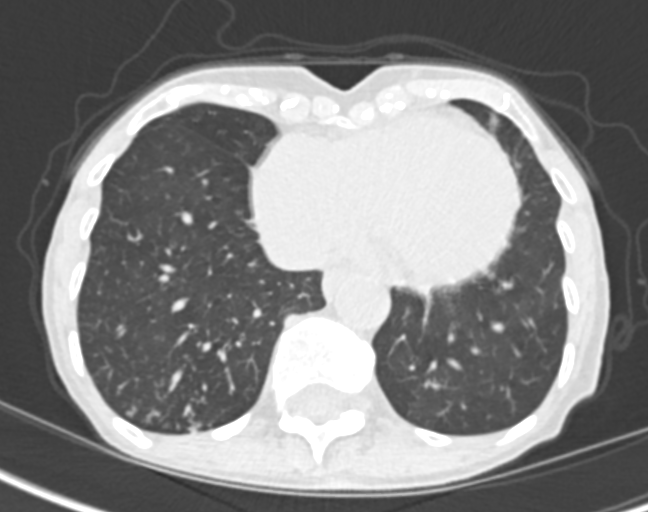
[im 62/161  lung]
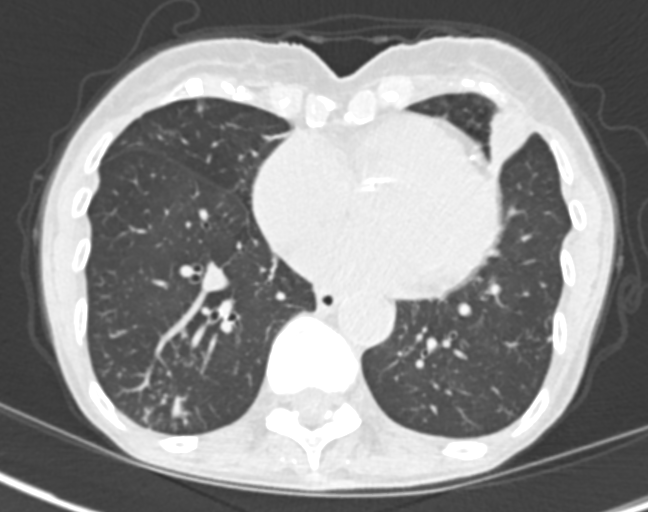
[im 87/161  mediastinal]
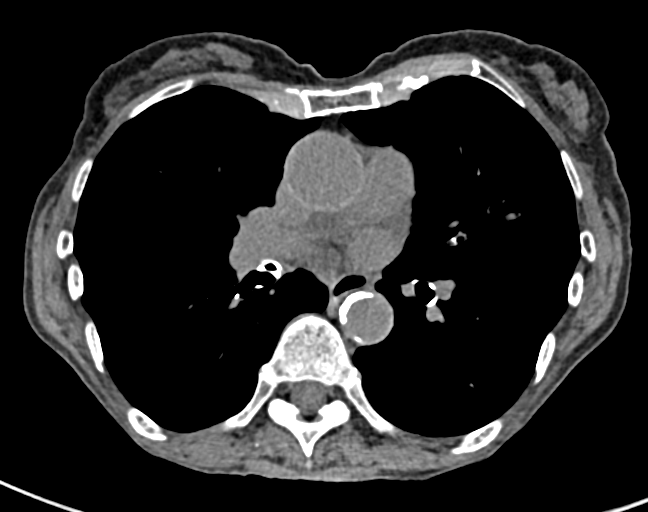
[im 87/161  lung]
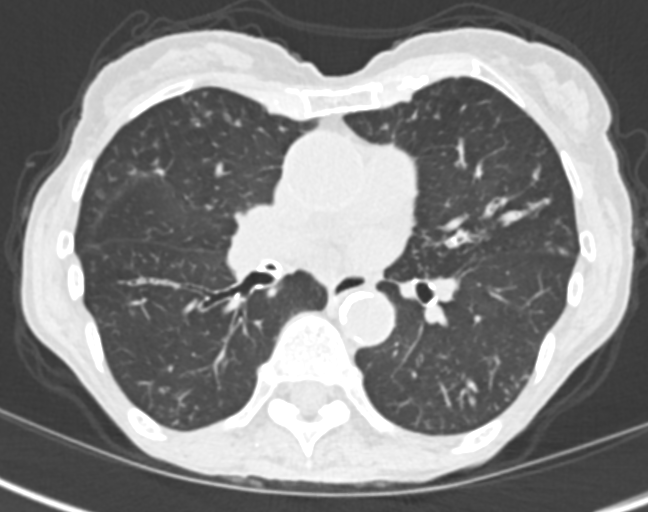
[im 99/161  lung]
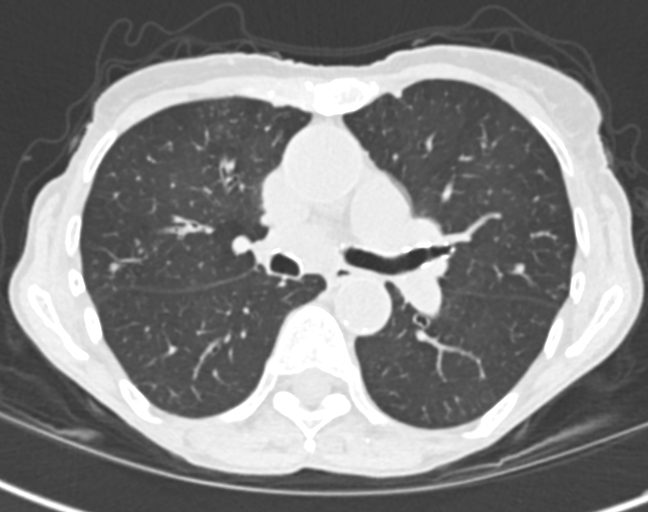
[im 111/161  lung]
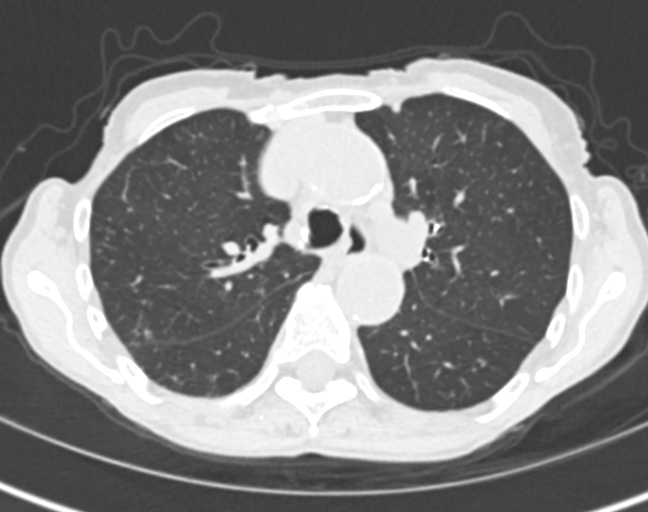
[im 136/161  lung]
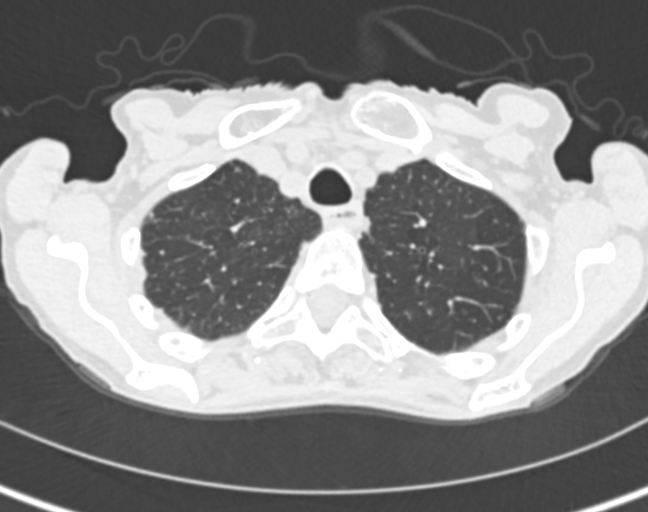
[im 148/161  mediastinal]
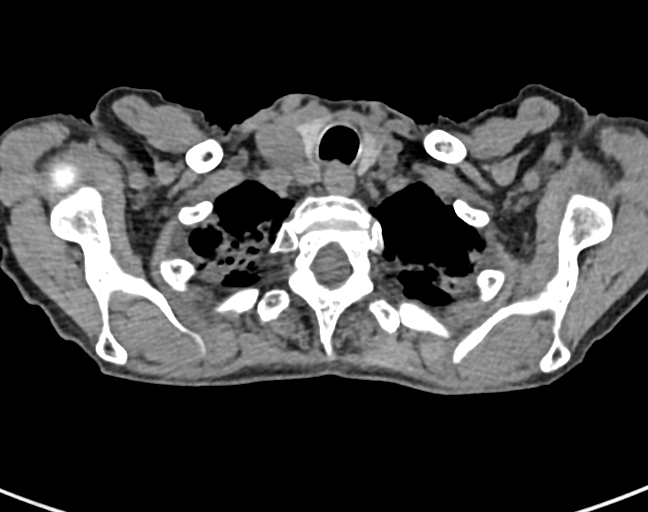
[im 148/161  lung]
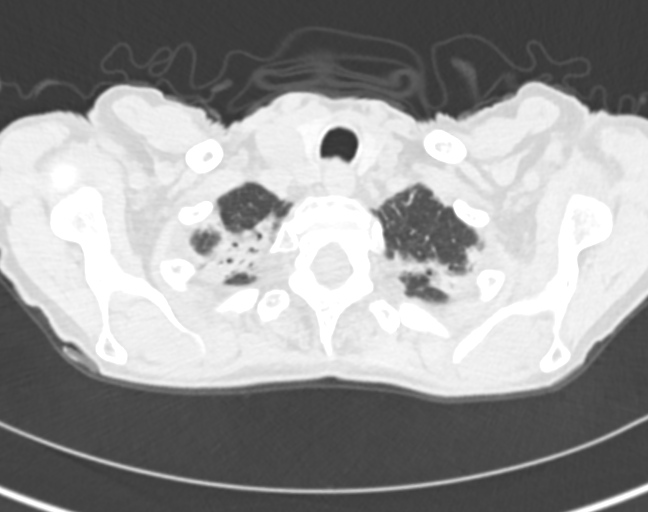

[Series 4: chest 2.00 br40 s3 cor · coronal · 0.55mm/px · 3 of 111 slices shown]
[im 23/111  lung]
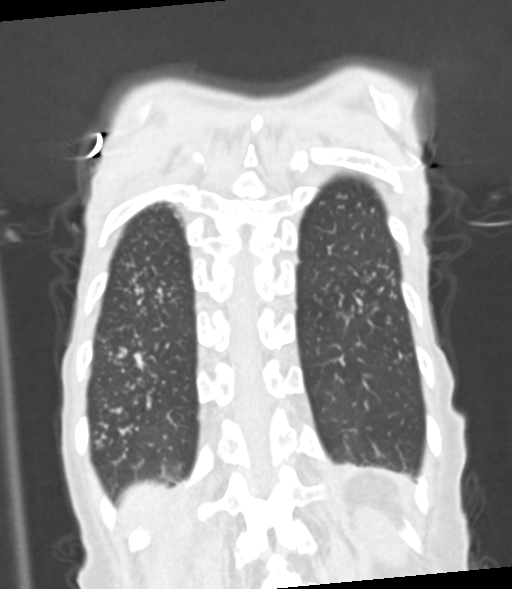
[im 45/111  lung]
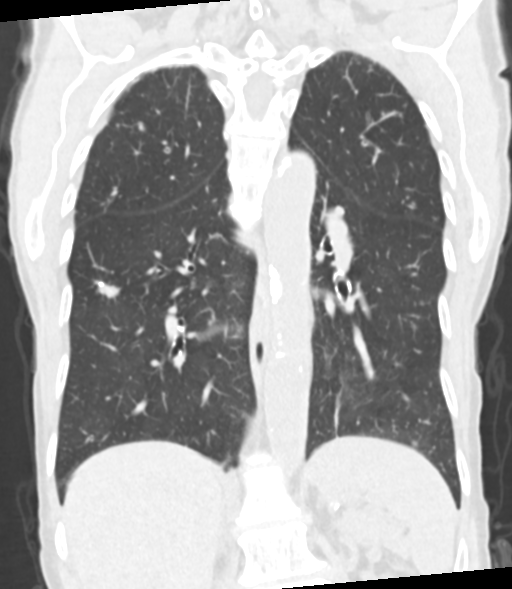
[im 67/111  lung]
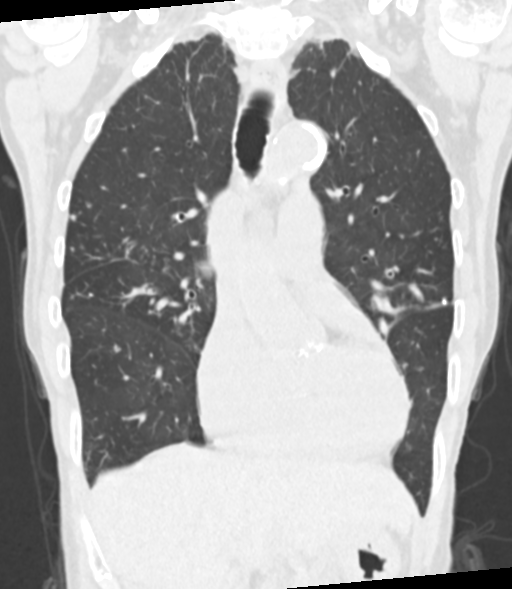

[12 of 36 positions shown; findings below may reference images not displayed]

FINDINGS: Cardiovascular: No acute findings. Aortic and coronary artery
atherosclerosis.

Mediastinum/Nodes: No masses or pathologically enlarged lymph nodes
identified on this unenhanced exam.

Lungs/Pleura: Focal area of atelectasis or infiltrate in the
inferior lingula remains stable. Associated mucoid impaction of
peripheral bronchi seen in this region in the inferior lingula as
well as the inferior right middle lobe and posterior right lower
lobe.

Numerous widely scattered sub-cm pulmonary nodules are seen
throughout both lungs, with associated tree-in-bud opacities. The
vast majority of these nodules show no significant change. Increased
tree-in-bud opacities are seen in the posterior right lower lobe,
with new associated nodules measuring up to 8 mm. This is consistent
with atypical infectious or inflammatory etiology, such as GRCP. No
evidence of central airway obstruction or pleural effusion.

Upper Abdomen:  Unremarkable.

Musculoskeletal:  No suspicious bone lesions.
IMPRESSION: Increased tree-in-bud opacities and new nodules measuring up to 8 mm
in the posterior right lower lobe. This is consistent with atypical
infectious or inflammatory etiologies, such as GRCP.

Other widely-scattered bilateral sub-cm pulmonary nodules and
tree-in-bud opacities remain stable.

No significant change in lingular atelectasis or infiltrate, with
associated mucoid impaction of peripheral bronchi.

No evidence of lymphadenopathy or pleural effusion.

Aortic Atherosclerosis ([id number].Z). Coronary artery calcification.

## 2020-02-28 ENCOUNTER — Encounter: Payer: Self-pay | Admitting: Cardiology

## 2020-02-28 ENCOUNTER — Other Ambulatory Visit: Payer: Self-pay

## 2020-02-28 ENCOUNTER — Ambulatory Visit: Payer: PPO | Admitting: Cardiology

## 2020-02-28 VITALS — BP 116/80 | HR 64 | Ht 63.5 in | Wt 90.4 lb

## 2020-02-28 DIAGNOSIS — I350 Nonrheumatic aortic (valve) stenosis: Secondary | ICD-10-CM | POA: Diagnosis not present

## 2020-02-28 DIAGNOSIS — I700 Atherosclerosis of aorta: Secondary | ICD-10-CM

## 2020-02-28 NOTE — Patient Instructions (Signed)
Medication Instructions:  Your provider recommends that you continue on your current medications as directed. Please refer to the Current Medication list given to you today.   *If you need a refill on your cardiac medications before your next appointment, please call your pharmacy*   Testing/Procedures: Your provider has requested that you have an echocardiogram. Echocardiography is a painless test that uses sound waves to create images of your heart. It provides your doctor with information about the size and shape of your heart and how well your heart's chambers and valves are working. This procedure takes approximately one hour. There are no restrictions for this procedure.     Follow-Up: At [facility], you and your health needs are our priority.  As part of our continuing mission to provide you with exceptional heart care, we have created designated Provider Care Teams.  These Care Teams include your primary Cardiologist (physician) and Advanced Practice Providers (APPs -  Physician Assistants and Nurse Practitioners) who all work together to provide you with the care you need, when you need it. Your next appointment:   12 month(s) The format for your next appointment:   In Person Provider:   You may see Dr. Marlou Porch or one of the following Advanced Practice Providers on your designated Care Team:    Truitt Merle, NP  Cecilie Kicks, NP  Kathyrn Drown, NP

## 2020-02-28 NOTE — Progress Notes (Signed)
Cardiology Office Note:    Date:  02/28/2020   ID:  Valerie Villa, DOB 1924/01/17, MRN [id number]  PCP:  Lajean Manes, MD  Cardiologist:  No primary care provider on file.  Electrophysiologist:  None   Referring MD: Lajean Manes, MD     History of Present Illness:    Valerie Villa is a 86 y.o. female here for the follow-up of aortic stenosis.  Previously described as mild to moderate.  She continues to lose weight.  Dr. Felipa Eth has talked to her about this she states.  Does not always use Ensure.  States that she eats 3 meals a day.  Encouraged more protein use.  She does have some lower extremity edema.  Likely venous insufficiency also perhaps low albumin.  Past Medical History:  Diagnosis Date  . Colon polyps   . Hearing impairment   . Hiatal hernia   . Hx of basal cell carcinoma   . Hypercholesteremia   . Hypertension   . Mild aortic stenosis   . Osteoporosis   . Seasonal allergies     History reviewed. No pertinent surgical history.  Current Medications: Current Meds  Medication Sig  . cyanocobalamin 100 MCG tablet Take 100 mcg by mouth daily.  . fexofenadine (ALLEGRA) 60 MG tablet Take 60 mg by mouth 2 (two) times daily.  . Misc Natural Products (TART CHERRY ADVANCED) CAPS Take 1,200 capsules by mouth 3 (three) times daily.     Allergies:   Actonel [risedronate sodium], Albuterol, Altace [ramipril], Atropine, Biaxin [clarithromycin], Ciprofloxacin, Clindamycin/lincomycin, Delsym [dextromethorphan], Fosamax [alendronate sodium], Hawthorn [crataegus oxyacantha], Hctz [hydrochlorothiazide], Listerine, Nasacort [triamcinolone], Neosporin [neomycin-bacitracin zn-polymyx], Polysporin [bacitracin-polymyxin b], Rhinocort [budesonide], Scope [antiseptic mouth rinse], Simvastatin, Tetracyclines & related, Vantin [cefpodoxime], Vioxx [rofecoxib], and Xyntha [antihem factor recomb (rfviii)]   Social History   Socioeconomic History  . Marital status: Widowed    Spouse name:  Not on file  . Number of children: Not on file  . Years of education: Not on file  . Highest education level: Not on file  Occupational History  . Not on file  Tobacco Use  . Smoking status: Former Smoker    Packs/day: 0.10    Years: 5.00    Pack years: 0.50    Types: Cigarettes    Quit date: 06/05/1964    Years since quitting: 55.7  . Smokeless tobacco: Never Used  . Tobacco comment: "social smoker"  Substance and Sexual Activity  . Alcohol use: No  . Drug use: No  . Sexual activity: Not on file  Other Topics Concern  . Not on file  Social History Narrative  . Not on file   Social Determinants of Health   Financial Resource Strain:   . Difficulty of Paying Living Expenses:   Food Insecurity:   . Worried About Charity fundraiser in the Last Year:   . Arboriculturist in the Last Year:   Transportation Needs:   . Film/video editor (Medical):   Marland Kitchen Lack of Transportation (Non-Medical):   Physical Activity:   . Days of Exercise per Week:   . Minutes of Exercise per Session:   Stress:   . Feeling of Stress :   Social Connections:   . Frequency of Communication with Friends and Family:   . Frequency of Social Gatherings with Friends and Family:   . Attends Religious Services:   . Active Member of Clubs or Organizations:   . Attends Archivist Meetings:   .  Marital Status:      Family History: The patient's family history includes Cancer in her sister; Heart disease in her father and mother.  ROS:   Please see the history of present illness.    No falls All other systems reviewed and are negative.  EKGs/Labs/Other Studies Reviewed:    The following studies were reviewed today: Echocardiogram previously  EKG:  EKG is  ordered today.  The ekg ordered today demonstrates sinus rhythm nonspecific ST-T wave changes 64 beats minute.  Recent Labs: No results found for requested labs within last 8760 hours.  Recent Lipid Panel No results found for: CHOL,  TRIG, HDL, CHOLHDL, VLDL, LDLCALC, LDLDIRECT  Physical Exam:    VS:  BP 116/80   Pulse 64   Ht 5' 3.5" (1.613 m)   Wt 90 lb 6.4 oz (41 kg)   SpO2 96%   BMI 15.76 kg/m     Wt Readings from Last 3 Encounters:  02/28/20 90 lb 6.4 oz (41 kg)  09/20/18 98 lb 12.8 oz (44.8 kg)  08/23/18 97 lb 9.6 oz (44.3 kg)     GEN: Thin in no acute distress HEENT: Normal NECK: No JVD; No carotid bruits LYMPHATICS: No lymphadenopathy CARDIAC: RRR, 2/6 SM, no rubs, gallops RESPIRATORY:  Clear to auscultation without rales, wheezing or rhonchi  ABDOMEN: Soft, non-tender, non-distended MUSCULOSKELETAL:  No edema; No deformity  SKIN: Warm and dry NEUROLOGIC:  Alert and oriented x 3 PSYCHIATRIC:  Normal affect   ASSESSMENT:    1. Nonrheumatic aortic valve stenosis   2. Aortic atherosclerosis ([place])    PLAN:    In order of problems listed above:  Aortic stenosis -Mild to moderate category.  Can repeat echocardiogram.  If it becomes severe and symptomatic may be a TAVR candidate in the future.  Peripheral vascular disease -2016 duplex revealed occlusion of short segment of superficial femoral artery bilaterally.  Continue with exercise.  She does have some lower extremity edema.  I bet you with her protein calorie malnutrition her albumin is low.  Encouraged the use of Ensure.  Aortic atherosclerosis -Prevention blood pressure control.  Elevated blood pressure -Continue to monitor.  Excellent today.   Medication Adjustments/Labs and Tests Ordered: Current medicines are reviewed at length with the patient today.  Concerns regarding medicines are outlined above.  Orders Placed This Encounter  Procedures  . EKG 12-Lead  . ECHOCARDIOGRAM COMPLETE   No orders of the defined types were placed in this encounter.   Patient Instructions  Medication Instructions:  Your provider recommends that you continue on your current medications as directed. Please refer to the Current Medication list  given to you today.   *If you need a refill on your cardiac medications before your next appointment, please call your pharmacy*   Testing/Procedures: Your provider has requested that you have an echocardiogram. Echocardiography is a painless test that uses sound waves to create images of your heart. It provides your doctor with information about the size and shape of your heart and how well your heart's chambers and valves are working. This procedure takes approximately one hour. There are no restrictions for this procedure.     Follow-Up: At [facility], you and your health needs are our priority.  As part of our continuing mission to provide you with exceptional heart care, we have created designated Provider Care Teams.  These Care Teams include your primary Cardiologist (physician) and Advanced Practice Providers (APPs -  Physician Assistants and Nurse Practitioners) who all  work together to provide you with the care you need, when you need it. Your next appointment:   12 month(s) The format for your next appointment:   In Person Provider:   You may see Dr. Marlou Porch or one of the following Advanced Practice Providers on your designated Care Team:    Truitt Merle, NP  Cecilie Kicks, NP  Kathyrn Drown, NP      Signed, Candee Furbish, MD  02/28/2020 5:26 PM    [place]

## 2020-03-05 DIAGNOSIS — S80811S Abrasion, right lower leg, sequela: Secondary | ICD-10-CM | POA: Diagnosis not present

## 2020-03-05 DIAGNOSIS — Z85828 Personal history of other malignant neoplasm of skin: Secondary | ICD-10-CM | POA: Diagnosis not present

## 2020-03-19 ENCOUNTER — Ambulatory Visit (HOSPITAL_COMMUNITY): Payer: PPO | Attending: Cardiovascular Disease

## 2020-03-19 ENCOUNTER — Other Ambulatory Visit: Payer: Self-pay

## 2020-03-19 DIAGNOSIS — I700 Atherosclerosis of aorta: Secondary | ICD-10-CM | POA: Diagnosis not present

## 2020-03-19 DIAGNOSIS — I350 Nonrheumatic aortic (valve) stenosis: Secondary | ICD-10-CM

## 2020-03-26 ENCOUNTER — Telehealth (HOSPITAL_COMMUNITY): Payer: Self-pay | Admitting: Radiology

## 2020-03-26 NOTE — Telephone Encounter (Signed)
Left a message for patient to call back. 

## 2020-03-27 ENCOUNTER — Telehealth (HOSPITAL_COMMUNITY): Payer: Self-pay | Admitting: Radiology

## 2020-03-27 NOTE — Telephone Encounter (Signed)
Ms. Alderman returned my call. She was able to provide my with detail concerning her echocardiogram experience. I apologized to her for the poor interaction with the sonographer performing the exam. I reiterated to her, that [place] first priority is the patient. She did not want to cause problems for the sonographer involved. I informed her that we use feedback from our patients to enhance their experiences and help train staff appropriately. She thanked me for calling.

## 2020-03-27 NOTE — Telephone Encounter (Signed)
Called patient to review echocardiogram appointment experience. I was unable to use the home phone number, so I left a message on cell phone to return call at earliest convenience.

## 2020-03-27 NOTE — Telephone Encounter (Signed)
Left message for patient to call me back. 

## 2020-03-27 NOTE — Telephone Encounter (Signed)
Patient's son, Valerie Villa, returned call to facilitate phone call with mother. [name] is heard of hearing. Echocardiogram results were reported to his mother, who did not understand. Could someone please call him with the results or DPR form so he may receive testing results? [name] phone number is [phone].

## 2020-03-27 NOTE — Telephone Encounter (Addendum)
Tried to call patient to get permission to talk to her son. Son is not on DPR. Will send to Dr. Marlou Porch nurse to follow up.

## 2020-03-28 NOTE — Telephone Encounter (Signed)
error 

## 2020-03-31 DIAGNOSIS — H90A32 Mixed conductive and sensorineural hearing loss, unilateral, left ear with restricted hearing on the contralateral side: Secondary | ICD-10-CM | POA: Diagnosis not present

## 2020-03-31 DIAGNOSIS — H90A21 Sensorineural hearing loss, unilateral, right ear, with restricted hearing on the contralateral side: Secondary | ICD-10-CM | POA: Diagnosis not present

## 2020-04-01 NOTE — Telephone Encounter (Signed)
DPR is noted under Active FYIS (9/202019) LM for John to c/b to review pt's echo results:  Aortic stenosis study personally reviewed. Appears more moderate than severe. We will continue to monitor with echocardiogram next year. Of course if symptoms such as shortness of breath develop she will let us know.  Candee Furbish, MD

## 2020-04-04 DIAGNOSIS — H00012 Hordeolum externum right lower eyelid: Secondary | ICD-10-CM | POA: Diagnosis not present

## 2020-04-08 ENCOUNTER — Telehealth: Payer: Self-pay | Admitting: Cardiology

## 2020-04-08 NOTE — Telephone Encounter (Signed)
Spoke with the patient's daughter and reviewed echocardiogram results with her. We will continue to monitor. Patient's daughter verbalized understanding.

## 2020-04-08 NOTE — Telephone Encounter (Signed)
Valerie Villa is returning call in regards to echo results.

## 2020-04-08 NOTE — Telephone Encounter (Signed)
Patient's son, Lakesha Levinson, called to return Pamela's message regarding the patient's echo results. Patient's son states the patient did not fully comprehend the results given to her, the patients son is requesting someone call him to interpret the results.

## 2020-04-08 NOTE — Telephone Encounter (Signed)
Attempted to call the pts Son back and phone kept ringing, then he hung the phone up. Pts Son Jenny Reichmann is not on pts DPR on file.  Only designated parties selected to receive any results on the pt from our office is the pts daughters.

## 2020-04-10 NOTE — Telephone Encounter (Signed)
Awaiting return call from son.  Will close this encounter.

## 2020-04-22 DIAGNOSIS — D1801 Hemangioma of skin and subcutaneous tissue: Secondary | ICD-10-CM | POA: Diagnosis not present

## 2020-04-22 DIAGNOSIS — L821 Other seborrheic keratosis: Secondary | ICD-10-CM | POA: Diagnosis not present

## 2020-04-22 DIAGNOSIS — Z85828 Personal history of other malignant neoplasm of skin: Secondary | ICD-10-CM | POA: Diagnosis not present

## 2020-04-22 DIAGNOSIS — L812 Freckles: Secondary | ICD-10-CM | POA: Diagnosis not present

## 2020-05-13 DIAGNOSIS — S80811A Abrasion, right lower leg, initial encounter: Secondary | ICD-10-CM | POA: Diagnosis not present

## 2020-05-13 DIAGNOSIS — Z85828 Personal history of other malignant neoplasm of skin: Secondary | ICD-10-CM | POA: Diagnosis not present

## 2020-05-22 DIAGNOSIS — Z79899 Other long term (current) drug therapy: Secondary | ICD-10-CM | POA: Diagnosis not present

## 2020-05-22 DIAGNOSIS — I129 Hypertensive chronic kidney disease with stage 1 through stage 4 chronic kidney disease, or unspecified chronic kidney disease: Secondary | ICD-10-CM | POA: Diagnosis not present

## 2020-05-22 DIAGNOSIS — K921 Melena: Secondary | ICD-10-CM | POA: Diagnosis not present

## 2020-05-22 DIAGNOSIS — R634 Abnormal weight loss: Secondary | ICD-10-CM | POA: Diagnosis not present

## 2020-05-22 DIAGNOSIS — K5901 Slow transit constipation: Secondary | ICD-10-CM | POA: Diagnosis not present

## 2020-05-22 DIAGNOSIS — N1831 Chronic kidney disease, stage 3a: Secondary | ICD-10-CM | POA: Diagnosis not present

## 2020-05-22 DIAGNOSIS — E441 Mild protein-calorie malnutrition: Secondary | ICD-10-CM | POA: Diagnosis not present

## 2020-06-03 DIAGNOSIS — E441 Mild protein-calorie malnutrition: Secondary | ICD-10-CM | POA: Diagnosis not present

## 2020-06-03 DIAGNOSIS — J449 Chronic obstructive pulmonary disease, unspecified: Secondary | ICD-10-CM | POA: Diagnosis not present

## 2020-06-03 DIAGNOSIS — Q253 Supravalvular aortic stenosis: Secondary | ICD-10-CM | POA: Diagnosis not present

## 2020-06-03 DIAGNOSIS — I739 Peripheral vascular disease, unspecified: Secondary | ICD-10-CM | POA: Diagnosis not present

## 2020-06-03 DIAGNOSIS — Z79899 Other long term (current) drug therapy: Secondary | ICD-10-CM | POA: Diagnosis not present

## 2020-06-03 DIAGNOSIS — Z0000 Encounter for general adult medical examination without abnormal findings: Secondary | ICD-10-CM | POA: Diagnosis not present

## 2020-06-03 DIAGNOSIS — Z1389 Encounter for screening for other disorder: Secondary | ICD-10-CM | POA: Diagnosis not present

## 2020-06-03 DIAGNOSIS — K219 Gastro-esophageal reflux disease without esophagitis: Secondary | ICD-10-CM | POA: Diagnosis not present

## 2020-06-03 DIAGNOSIS — I129 Hypertensive chronic kidney disease with stage 1 through stage 4 chronic kidney disease, or unspecified chronic kidney disease: Secondary | ICD-10-CM | POA: Diagnosis not present

## 2020-06-03 DIAGNOSIS — I700 Atherosclerosis of aorta: Secondary | ICD-10-CM | POA: Diagnosis not present

## 2020-06-03 DIAGNOSIS — E7800 Pure hypercholesterolemia, unspecified: Secondary | ICD-10-CM | POA: Diagnosis not present

## 2020-06-03 DIAGNOSIS — N1831 Chronic kidney disease, stage 3a: Secondary | ICD-10-CM | POA: Diagnosis not present

## 2020-06-03 DIAGNOSIS — I519 Heart disease, unspecified: Secondary | ICD-10-CM | POA: Diagnosis not present

## 2020-06-24 DIAGNOSIS — D2371 Other benign neoplasm of skin of right lower limb, including hip: Secondary | ICD-10-CM | POA: Diagnosis not present

## 2020-06-24 DIAGNOSIS — Z85828 Personal history of other malignant neoplasm of skin: Secondary | ICD-10-CM | POA: Diagnosis not present

## 2020-06-30 ENCOUNTER — Encounter: Payer: Self-pay | Admitting: Physical Therapy

## 2020-06-30 ENCOUNTER — Other Ambulatory Visit: Payer: Self-pay

## 2020-06-30 ENCOUNTER — Ambulatory Visit: Payer: PPO | Attending: Geriatric Medicine | Admitting: Physical Therapy

## 2020-06-30 DIAGNOSIS — R293 Abnormal posture: Secondary | ICD-10-CM | POA: Diagnosis not present

## 2020-06-30 DIAGNOSIS — R2689 Other abnormalities of gait and mobility: Secondary | ICD-10-CM | POA: Diagnosis not present

## 2020-06-30 DIAGNOSIS — M6281 Muscle weakness (generalized): Secondary | ICD-10-CM

## 2020-06-30 NOTE — Therapy (Signed)
[place], [place], [address] Phone: [phone]   Fax:  [phone]  Physical Therapy Evaluation  Patient Details  Name: Valerie Villa MRN: [id number] Date of Birth: May 04, 1924 Referring Provider (PT): Lajean Manes, MD   Encounter Date: 06/30/2020   PT End of Session - 06/30/20 1323    Visit Number 1    Number of Visits 6    Date for PT Re-Evaluation 08/11/20    Authorization Type [employer]    PT Start Time 1215    PT Stop Time 1300    PT Time Calculation (min) 45 min    Activity Tolerance Patient tolerated treatment well    Behavior During Therapy [facility] for tasks assessed/performed           Past Medical History:  Diagnosis Date   Colon polyps    Hearing impairment    Hiatal hernia    Hx of basal cell carcinoma    Hypercholesteremia    Hypertension    Mild aortic stenosis    Osteoporosis    Seasonal allergies     History reviewed. No pertinent surgical history.  There were no vitals filed for this visit.    Subjective Assessment - 06/30/20 1217    Subjective Patient reports she feels like her body is not cooperating with her as much, and she would like to work on her walking and balance to avoid falls. Patient states she doesn't feel balanced, this has been going on for the past 6 months. She denies any falls and states she does feel limited with her walking and ability to perform heavier household tasks.    Limitations Walking;Standing;House hold activities    How long can you sit comfortably? No limitations    How long can you stand comfortably? No limitation    How long can you walk comfortably? About 1 mile - still feels out of balance    Diagnostic tests None    Patient Stated Goals Improve balance and walking    Currently in Pain? No/denies              [facility] PT Assessment - 06/30/20 0001      Assessment   Medical Diagnosis Unspecified abnormalities of  gait and mobility    Referring Provider (PT) Lajean Manes, MD    Onset Date/Surgical Date --   approximately 6 months   Next MD Visit Unsure    Prior Therapy None      Precautions   Precautions None      Restrictions   Weight Bearing Restrictions No      Balance Screen   Has the patient fallen in the past 6 months No    Has the patient had a decrease in activity level because of a fear of falling?  No    Is the patient reluctant to leave their home because of a fear of falling?  No      Home Environment   Living Environment Private residence    Living Arrangements Alone    Type of Guy to enter    Entrance Stairs-Number of Steps 7-8    Entrance Stairs-Rails Right    Home Layout Two level   patient notes she stays downstairs     Prior Function   Level of Independence Independent    Leisure Reading, listening to music, walking      Cognition   Overall Cognitive Status Within Functional Limits  for tasks assessed      Observation/Other Assessments   Observations Patient appears in no apparent distress    Focus on Therapeutic Outcomes (FOTO)  64% limitation      Sensation   Light Touch Appears Intact      Coordination   Gross Motor Movements are Fluid and Coordinated Yes      Posture/Postural Control   Posture Comments Patient demonstrated rounded shoulder and forward head posture, increased thoracic kyphosis      ROM / Strength   AROM / PROM / Strength Strength;AROM      AROM   Overall AROM Comments Patient does exhibit slight limitation with thoracic and shoulder motion      Strength   Overall Strength Comments Periscapular strength grossly 4-/5 MMT    Strength Assessment Site Hip;Knee;Ankle    Right/Left Hip Right;Left    Right Hip Flexion 4/5    Right Hip Extension 4-/5    Right Hip ABduction 4-/5    Left Hip Flexion 4/5    Left Hip Extension 4-/5    Left Hip ABduction 4-/5    Right/Left Knee Right;Left    Right Knee Flexion  4/5    Right Knee Extension 4+/5    Left Knee Flexion 4/5    Left Knee Extension 4+/5    Right/Left Ankle Right;Left    Right Ankle Dorsiflexion 5/5    Right Ankle Plantar Flexion 4/5    Left Ankle Dorsiflexion 5/5    Left Ankle Plantar Flexion 4/5      Palpation   Palpation comment Non-TTP      Special Tests   Other special tests None performed      Transfers   Transfers Independent with all Transfers    Five time sit to stand comments  Patient does tend to stagger her stance      Ambulation/Gait   Ambulation/Gait Yes    Ambulation/Gait Assistance 7: Independent    Gait Comments Decreased gait speed, trendelenburg bilaterally, slight trunk lean to left with patient reporting inbalance to left side      Balance   Balance Assessed Yes      Standardized Balance Assessment   Standardized Balance Assessment Five Times Sit to Stand;Timed Up and Go Test    Five times sit to stand comments  16.35 seconds      Timed Up and Go Test   TUG Normal TUG    Normal TUG (seconds) 14.5      High Level Balance   High Level Balance Comments Patient unable to maintain SLS bilaterally                      Objective measurements completed on examination: See above findings.       [facility] Adult PT Treatment/Exercise - 06/30/20 0001      Exercises   Exercises Shoulder;Knee/Hip      Knee/Hip Exercises: Standing   Heel Raises 10 reps    Knee Flexion 10 reps    Knee Flexion Limitations hamstring curl    Hip Flexion 10 reps    Hip Flexion Limitations alternating march    Hip Abduction 10 reps    Hip Extension 10 reps      Knee/Hip Exercises: Seated   Long Arc Quad 10 reps    Sit to [employer] 10 reps   cue to keep feet even     Shoulder Exercises: Standing   Row 10 reps    Theraband Level (Shoulder Row) Level  1 (Yellow)                  PT Education - 06/30/20 1326    Education Details Exam findings, POC, HEP    Person(s) Educated Patient    Methods  Explanation;Demonstration;Verbal cues;Handout;Tactile cues    Comprehension Verbalized understanding;Returned demonstration;Verbal cues required;Tactile cues required;Need further instruction            PT Short Term Goals - 06/30/20 1322      PT SHORT TERM GOAL #1   Title Patient will be I with initial HEP to progress in PT    Time 3    Period Weeks    Status New    Target Date 07/21/20             PT Long Term Goals - 06/30/20 1325      PT LONG TERM GOAL #1   Title Patient will be I with final HEP to maintain progress from PT    Time 6    Period Weeks    Status New    Target Date 08/11/20      PT LONG TERM GOAL #2   Title Patient will demonstrate improved 5xSTS to </= 14 seconds to improve strength and reduce fall risk    Time 6    Period Weeks    Status New    Target Date 08/11/20      PT LONG TERM GOAL #3   Title Patient will demonsrtated improved TUG </= 13 seconds to reduce fall risk    Time 6    Period Weeks    Status New    Target Date 08/11/20      PT LONG TERM GOAL #4   Title Patient will exhibit improved gross BLE strength to >/= 4+/5 MMT in order to improve walking tolerance and stairs    Time 6    Period Weeks    Status New    Target Date 08/11/20      PT LONG TERM GOAL #5   Title Patient will report improved functional level to </= 58% limitation on FOTO    Time 6    Period Weeks    Status New    Target Date 08/11/20                  Plan - 06/30/20 1329    Clinical Impression Statement Patient presents to PT with report of feeling imbalanced with walking and household activities. She does demonstrate balance deficit and generalized BLE weakness likely contributing to her symptoms. Patient also exhibits postural deficits consisting of rounded shoulder and kyphotic posture. She was provided with initial HEP to initiate BLE and postural strengthening and she would benefit from continued skilled PT to improve balance and walking ability  in order to maximize functional level.    Personal Factors and Comorbidities Time since onset of injury/illness/exacerbation;Age;Fitness    Examination-Activity Limitations Locomotion Level;Stairs;Squat    Examination-Participation Restrictions Meal Prep;Community Activity;Yard Work    Stability/Clinical Decision Making Stable/Uncomplicated    Designer, jewellery Low    Rehab Potential Good    PT Frequency 1x / week    PT Duration 6 weeks    PT Treatment/Interventions ADLs/Self Care Home Management;Neuromuscular re-education;Balance training;Therapeutic exercise;Therapeutic activities;Functional mobility training;Stair training;Gait training;Patient/family education;Manual techniques;Dry needling;Passive range of motion;Spinal Manipulations;Joint Manipulations;Taping;Cryotherapy;Electrical Stimulation;Moist Heat    PT Next Visit Plan Review HEP and progress PRN, general BLE and postural strengthening, initiate balance training    PT Home Exercise Plan  [email or web address]: LAQ, sit<>stand, standing hip abduction, extension, marching, hamstring curl, heel raises, banded row with yellow    Consulted and Agree with Plan of Care Patient           Patient will benefit from skilled therapeutic intervention in order to improve the following deficits and impairments:  Abnormal gait, Difficulty walking, Postural dysfunction, Decreased strength, Decreased balance, Decreased range of motion  Visit Diagnosis: Other abnormalities of gait and mobility  Muscle weakness (generalized)  Abnormal posture     Problem List Patient Active Problem List   Diagnosis Date Noted   Nonrheumatic aortic valve stenosis 05/30/2017   COPD, mild ([place]) 06/05/2014    Hilda Blades, PT, DPT, LAT, ATC 06/30/20  1:52 PM Phone: [phone] Fax: [place] [facility] [address] [place], [place], [address] Phone: [phone]   Fax:  [phone]  Name: Valerie Villa MRN: [id number] Date of Birth: 1923/12/15

## 2020-06-30 NOTE — Patient Instructions (Signed)
Access Code: [email or web address] URL: [email or web address] Date: 06/30/2020 Prepared by: Hilda Blades  Exercises Seated Long Arc Quad - 1 x daily - 7 x weekly - 2 sets - 10 reps Sit to Stand - 1 x daily - 7 x weekly - 2 sets - 10 reps Standing Hip Abduction with Counter Support - 1 x daily - 7 x weekly - 2 sets - 10 reps Standing Hip Extension with Counter Support - 1 x daily - 7 x weekly - 2 sets - 10 reps Standing March with Counter Support - 1 x daily - 7 x weekly - 2 sets - 10 reps Standing Knee Flexion with Counter Support - 1 x daily - 7 x weekly - 2 sets - 10 reps Heel rises with counter support - 1 x daily - 7 x weekly - 2 sets - 10 reps Standing Row with Anchored Resistance - 1 x daily - 7 x weekly - 2 sets - 10 reps

## 2020-07-01 ENCOUNTER — Ambulatory Visit: Payer: PPO

## 2020-07-02 ENCOUNTER — Other Ambulatory Visit: Payer: Self-pay

## 2020-07-03 DIAGNOSIS — Z961 Presence of intraocular lens: Secondary | ICD-10-CM | POA: Diagnosis not present

## 2020-07-03 DIAGNOSIS — H26493 Other secondary cataract, bilateral: Secondary | ICD-10-CM | POA: Diagnosis not present

## 2020-07-03 DIAGNOSIS — H5211 Myopia, right eye: Secondary | ICD-10-CM | POA: Diagnosis not present

## 2020-07-03 DIAGNOSIS — H353131 Nonexudative age-related macular degeneration, bilateral, early dry stage: Secondary | ICD-10-CM | POA: Diagnosis not present

## 2020-07-10 ENCOUNTER — Ambulatory Visit: Payer: Self-pay | Attending: Internal Medicine

## 2020-07-10 DIAGNOSIS — Z23 Encounter for immunization: Secondary | ICD-10-CM

## 2020-07-10 NOTE — Progress Notes (Signed)
   Covid-19 Vaccination Clinic  Name:  FAREEDAH MAHLER    MRN: [id number] DOB: 05-23-1924  07/10/2020  Ms. Rieger was observed post Covid-19 immunization for 15 minutes without incident. She was provided with Vaccine Information Sheet and instruction to access the V-Safe system.   Ms. Vallandingham was instructed to call 911 with any severe reactions post vaccine: Marland Kitchen Difficulty breathing  . Swelling of face and throat  . A fast heartbeat  . A bad rash all over body  . Dizziness and weakness

## 2020-07-15 ENCOUNTER — Other Ambulatory Visit: Payer: Self-pay

## 2020-07-15 ENCOUNTER — Ambulatory Visit: Payer: PPO | Attending: Geriatric Medicine | Admitting: Physical Therapy

## 2020-07-15 ENCOUNTER — Encounter: Payer: Self-pay | Admitting: Physical Therapy

## 2020-07-15 DIAGNOSIS — R293 Abnormal posture: Secondary | ICD-10-CM

## 2020-07-15 DIAGNOSIS — R2689 Other abnormalities of gait and mobility: Secondary | ICD-10-CM | POA: Diagnosis not present

## 2020-07-15 DIAGNOSIS — M6281 Muscle weakness (generalized): Secondary | ICD-10-CM | POA: Diagnosis not present

## 2020-07-15 NOTE — Therapy (Signed)
[place], [place], [address] Phone: [phone]   Fax:  [phone]  Physical Therapy Treatment  Patient Details  Name: ARGENTINA KOSCH MRN: [id number] Date of Birth: 1924/09/09 Referring Provider (PT): Lajean Manes, MD   Encounter Date: 07/15/2020   PT End of Session - 07/15/20 1222    Visit Number 2    Number of Visits 6    Date for PT Re-Evaluation 08/11/20    Authorization Type [employer]    PT Start Time 1215    PT Stop Time 1255    PT Time Calculation (min) 40 min    Activity Tolerance Patient tolerated treatment well    Behavior During Therapy [facility] for tasks assessed/performed           Past Medical History:  Diagnosis Date  . Colon polyps   . Hearing impairment   . Hiatal hernia   . Hx of basal cell carcinoma   . Hypercholesteremia   . Hypertension   . Mild aortic stenosis   . Osteoporosis   . Seasonal allergies     History reviewed. No pertinent surgical history.  There were no vitals filed for this visit.   Subjective Assessment - 07/15/20 1220    Subjective Patient reports she is doing well. She is having trouble doing the exercise standing and sitting from the chair.    Patient Stated Goals Improve balance and walking    Currently in Pain? No/denies                             [facility] Adult PT Treatment/Exercise - 07/15/20 0001      Exercises   Exercises Shoulder;Knee/Hip      Knee/Hip Exercises: Aerobic   Nustep L3 x 5 min (LE only)      Knee/Hip Exercises: Standing   Hip Flexion 10 reps    Hip Flexion Limitations alternating march    Hip Abduction 2 sets;10 reps    Abduction Limitations yellow band around knees    Hip Extension 2 sets;10 reps    Extension Limitations yellow band around knees      Knee/Hip Exercises: Seated   Long Arc Quad 2 sets;10 reps    Long Arc Quad Weight 2 lbs.    Sit to Sand 2 sets;10 reps   2nd set without UE  support     Knee/Hip Exercises: Supine   Bridges 2 sets;10 reps   3 sec hold   Straight Leg Raises 2 sets;10 reps                  PT Education - 07/15/20 1221    Education Details HEP, balance training    Person(s) Educated Patient    Methods Explanation;Demonstration;Verbal cues;Handout    Comprehension Verbalized understanding;Returned demonstration;Verbal cues required;Need further instruction            PT Short Term Goals - 06/30/20 1322      PT SHORT TERM GOAL #1   Title Patient will be I with initial HEP to progress in PT    Time 3    Period Weeks    Status New    Target Date 07/21/20             PT Long Term Goals - 06/30/20 1325      PT LONG TERM GOAL #1   Title Patient will be I with final HEP to maintain progress from  PT    Time 6    Period Weeks    Status New    Target Date 08/11/20      PT LONG TERM GOAL #2   Title Patient will demonstrate improved 5xSTS to </= 14 seconds to improve strength and reduce fall risk    Time 6    Period Weeks    Status New    Target Date 08/11/20      PT LONG TERM GOAL #3   Title Patient will demonsrtated improved TUG </= 13 seconds to reduce fall risk    Time 6    Period Weeks    Status New    Target Date 08/11/20      PT LONG TERM GOAL #4   Title Patient will exhibit improved gross BLE strength to >/= 4+/5 MMT in order to improve walking tolerance and stairs    Time 6    Period Weeks    Status New    Target Date 08/11/20      PT LONG TERM GOAL #5   Title Patient will report improved functional level to </= 58% limitation on FOTO    Time 6    Period Weeks    Status New    Target Date 08/11/20                 Plan - 07/15/20 1223    Clinical Impression Statement Patient tolerated well with no adverse effects. Therapy focused on progressing strength and balance, and patient was able to tolerate added resistance well. She does continue to report feeling of balance difficulty with walking so  incorporated balance training and added to HEP. She would benefit from continued skilled PT to improve balance and walking ability in order to maximize functional level.    PT Treatment/Interventions ADLs/Self Care Home Management;Neuromuscular re-education;Balance training;Therapeutic exercise;Therapeutic activities;Functional mobility training;Stair training;Gait training;Patient/family education;Manual techniques;Dry needling;Passive range of motion;Spinal Manipulations;Joint Manipulations;Taping;Cryotherapy;Electrical Stimulation;Moist Heat    PT Next Visit Plan Review HEP and progress PRN, general BLE and postural strengthening, initiate balance training    PT Home Exercise Plan [email or web address]: LAQ, sit<>stand, standing hip abduction, extension, marching, hamstring curl, heel raises, banded row with yellow    Consulted and Agree with Plan of Care Patient           Patient will benefit from skilled therapeutic intervention in order to improve the following deficits and impairments:  Abnormal gait, Difficulty walking, Postural dysfunction, Decreased strength, Decreased balance, Decreased range of motion  Visit Diagnosis: Other abnormalities of gait and mobility  Muscle weakness (generalized)  Abnormal posture     Problem List Patient Active Problem List   Diagnosis Date Noted  . Nonrheumatic aortic valve stenosis 05/30/2017  . COPD, mild ([place]) 06/05/2014    Hilda Blades, PT, DPT, LAT, ATC 07/15/20  1:15 PM Phone: [phone] Fax: [place] [facility] [address] [place], [place], [address] Phone: [phone]   Fax:  [phone]  Name: Valerie Villa MRN: [id number] Date of Birth: Aug 26, 1924

## 2020-07-15 NOTE — Patient Instructions (Signed)
Access Code: [email or web address] URL: [email or web address] Date: 07/15/2020 Prepared by: Hilda Blades  Exercises Seated Long Arc Quad - 1 x daily - 7 x weekly - 2 sets - 10 reps Sit to Stand - 1 x daily - 7 x weekly - 2 sets - 10 reps Standing Hip Abduction with Counter Support - 1 x daily - 7 x weekly - 2 sets - 10 reps Standing Hip Extension with Counter Support - 1 x daily - 7 x weekly - 2 sets - 10 reps Standing March with Counter Support - 1 x daily - 7 x weekly - 2 sets - 10 reps Standing Knee Flexion with Counter Support - 1 x daily - 7 x weekly - 2 sets - 10 reps Heel rises with counter support - 1 x daily - 7 x weekly - 2 sets - 10 reps Standing Row with Anchored Resistance - 1 x daily - 7 x weekly - 2 sets - 10 reps Standing Tandem Balance with Counter Support - 1 x daily - 7 x weekly - 2 sets - 30 seconds hold

## 2020-07-22 ENCOUNTER — Ambulatory Visit: Payer: PPO | Admitting: Physical Therapy

## 2020-07-29 ENCOUNTER — Other Ambulatory Visit: Payer: Self-pay

## 2020-07-29 ENCOUNTER — Ambulatory Visit: Payer: PPO | Admitting: Physical Therapy

## 2020-07-29 DIAGNOSIS — R2689 Other abnormalities of gait and mobility: Secondary | ICD-10-CM

## 2020-07-29 DIAGNOSIS — M6281 Muscle weakness (generalized): Secondary | ICD-10-CM

## 2020-07-29 DIAGNOSIS — R293 Abnormal posture: Secondary | ICD-10-CM

## 2020-07-29 NOTE — Therapy (Signed)
[place], [place], [address] Phone: [phone]   Fax:  [phone]  Physical Therapy Treatment  Patient Details  Name: Valerie Villa MRN: [id number] Date of Birth: 06-16-1924 Referring Provider (PT): Lajean Manes, MD   Encounter Date: 07/29/2020   PT End of Session - 07/29/20 1139    Visit Number 3    Number of Visits 6    Date for PT Re-Evaluation 08/11/20    Authorization Type [employer]    PT Start Time 1130    PT Stop Time 1215    PT Time Calculation (min) 45 min    Activity Tolerance Patient tolerated treatment well    Behavior During Therapy [facility] for tasks assessed/performed           Past Medical History:  Diagnosis Date  . Colon polyps   . Hearing impairment   . Hiatal hernia   . Hx of basal cell carcinoma   . Hypercholesteremia   . Hypertension   . Mild aortic stenosis   . Osteoporosis   . Seasonal allergies     No past surgical history on file.  There were no vitals filed for this visit.   Subjective Assessment - 07/29/20 1136    Subjective Patient reports that her bones have been hurting more recently, she tooks some trash out this morning and had an instance where her whole neck and back locked up and hurt, but then went away. Currently she is feeling better and not really having much pain. She hasn't been able to do her exercises as much because she has been feeling sickly.    Patient Stated Goals Improve balance and walking    Currently in Pain? No/denies                             [facility] Adult PT Treatment/Exercise - 07/29/20 0001      Neuro Re-ed    Neuro Re-ed Details  Tandem stance 3 x 30 sec each      Exercises   Exercises Shoulder;Knee/Hip      Knee/Hip Exercises: Stretches   Other Knee/Hip Stretches LTR 5 x 10 sec    Other Knee/Hip Stretches SKTC stretch 3 x 20 sec each      Knee/Hip Exercises: Aerobic   Nustep L3 x 5 min (LE  and UE)      Knee/Hip Exercises: Standing   Hip Abduction 2 sets;10 reps    Hip Extension 2 sets;10 reps      Knee/Hip Exercises: Seated   Sit to Sand 2 sets;10 reps   without UE support     Knee/Hip Exercises: Supine   Bridges 10 reps    Straight Leg Raises 10 reps      Shoulder Exercises: Supine   Horizontal ABduction 10 reps   2 sets   Theraband Level (Shoulder Horizontal ABduction) Level 1 (Yellow)      Shoulder Exercises: Standing   Row 10 reps   2 sets   Theraband Level (Shoulder Row) Level 2 (Red)      Shoulder Exercises: Stretch   Other Shoulder Stretches Sidelying thoracic rotation with HBH 5 x 10 sec each                  PT Education - 07/29/20 1138    Education Details HEP, posture    Person(s) Educated Patient    Methods Explanation;Demonstration;Verbal cues;Handout    Comprehension  Verbalized understanding;Returned demonstration;Need further instruction;Verbal cues required            PT Short Term Goals - 07/29/20 1240      PT SHORT TERM GOAL #1   Title Patient will be I with initial HEP to progress in PT    Time 3    Period Weeks    Status Achieved    Target Date 07/21/20             PT Long Term Goals - 06/30/20 1325      PT LONG TERM GOAL #1   Title Patient will be I with final HEP to maintain progress from PT    Time 6    Period Weeks    Status New    Target Date 08/11/20      PT LONG TERM GOAL #2   Title Patient will demonstrate improved 5xSTS to </= 14 seconds to improve strength and reduce fall risk    Time 6    Period Weeks    Status New    Target Date 08/11/20      PT LONG TERM GOAL #3   Title Patient will demonsrtated improved TUG </= 13 seconds to reduce fall risk    Time 6    Period Weeks    Status New    Target Date 08/11/20      PT LONG TERM GOAL #4   Title Patient will exhibit improved gross BLE strength to >/= 4+/5 MMT in order to improve walking tolerance and stairs    Time 6    Period Weeks    Status  New    Target Date 08/11/20      PT LONG TERM GOAL #5   Title Patient will report improved functional level to </= 58% limitation on FOTO    Time 6    Period Weeks    Status New    Target Date 08/11/20                 Plan - 07/29/20 1233    Clinical Impression Statement Patient tolerated well with no adverse effects. She reported onset of episodic neck and back pain recently so worked on some stretching and strengthening exercises to address this. Patient continues to report feeling like she is crooked in regard to shoulder, neck, thoracic region so updated HEP to incorporate some mobility and strengthening exercises to improve posture. Continued with balance training this visit with good tolerance. Patient did not report any pain with therapy. She would benefit from continued skilled PT to improve balance and walking ability in order to maximize functional level.    PT Treatment/Interventions ADLs/Self Care Home Management;Neuromuscular re-education;Balance training;Therapeutic exercise;Therapeutic activities;Functional mobility training;Stair training;Gait training;Patient/family education;Manual techniques;Dry needling;Passive range of motion;Spinal Manipulations;Joint Manipulations;Taping;Cryotherapy;Electrical Stimulation;Moist Heat    PT Next Visit Plan Review HEP and progress PRN, general BLE and postural strengthening, initiate balance training    PT Home Exercise Plan [email or web address]: LAQ, sit<>stand, standing hip abduction, extension, marching, hamstring curl, heel raises, tandem balance, banded row with red, supine horiz abd with yellow, sidelying thoracic rotatation    Consulted and Agree with Plan of Care Patient           Patient will benefit from skilled therapeutic intervention in order to improve the following deficits and impairments:  Abnormal gait, Difficulty walking, Postural dysfunction, Decreased strength, Decreased balance, Decreased range of motion  Visit  Diagnosis: Other abnormalities of gait and mobility  Muscle weakness (generalized)  Abnormal posture  Problem List Patient Active Problem List   Diagnosis Date Noted  . Nonrheumatic aortic valve stenosis 05/30/2017  . COPD, mild ([place]) 06/05/2014    Hilda Blades, PT, DPT, LAT, ATC 07/29/20  12:46 PM Phone: [phone] Fax: [place] [facility] [address] [place], [place], [address] Phone: [phone]   Fax:  [phone]  Name: Valerie Villa MRN: [id number] Date of Birth: [birth year]

## 2020-07-30 DIAGNOSIS — Z23 Encounter for immunization: Secondary | ICD-10-CM | POA: Diagnosis not present

## 2020-08-05 ENCOUNTER — Encounter: Payer: Self-pay | Admitting: Physical Therapy

## 2020-08-05 ENCOUNTER — Other Ambulatory Visit: Payer: Self-pay

## 2020-08-05 ENCOUNTER — Ambulatory Visit: Payer: PPO | Attending: Geriatric Medicine | Admitting: Physical Therapy

## 2020-08-05 DIAGNOSIS — R2689 Other abnormalities of gait and mobility: Secondary | ICD-10-CM | POA: Insufficient documentation

## 2020-08-05 DIAGNOSIS — M6281 Muscle weakness (generalized): Secondary | ICD-10-CM | POA: Diagnosis not present

## 2020-08-05 DIAGNOSIS — R293 Abnormal posture: Secondary | ICD-10-CM | POA: Diagnosis not present

## 2020-08-05 NOTE — Therapy (Signed)
[place], [place], [address] Phone: [phone]   Fax:  [phone]  Physical Therapy Treatment / ERO  Patient Details  Name: Valerie Villa MRN: [id number] Date of Birth: 1923/12/25 Referring Provider (PT): Lajean Manes, MD   Encounter Date: 08/05/2020   PT End of Session - 08/05/20 1226    Visit Number 4    Number of Visits 10    Date for PT Re-Evaluation 09/16/20    Authorization Type [employer]    PT Start Time 1215    PT Stop Time 1300    PT Time Calculation (min) 45 min    Activity Tolerance Patient tolerated treatment well    Behavior During Therapy [facility] for tasks assessed/performed           Past Medical History:  Diagnosis Date  . Colon polyps   . Hearing impairment   . Hiatal hernia   . Hx of basal cell carcinoma   . Hypercholesteremia   . Hypertension   . Mild aortic stenosis   . Osteoporosis   . Seasonal allergies     History reviewed. No pertinent surgical history.  There were no vitals filed for this visit.   Subjective Assessment - 08/05/20 1222    Subjective Patient reports she is doing better, she has not had any of the instances where she has the severe pain and locking up since last time. She has been doing her exercises as best as she can.    How long can you sit comfortably? No limitations    How long can you walk comfortably? About 1 mile    Patient Stated Goals Improve balance and walking    Currently in Pain? No/denies              [facility] PT Assessment - 08/05/20 0001      Assessment   Medical Diagnosis Unspecified abnormalities of gait and mobility    Referring Provider (PT) Lajean Manes, MD      Precautions   Precautions None      Restrictions   Weight Bearing Restrictions No      Balance Screen   Has the patient fallen in the past 6 months No    Has the patient had a decrease in activity level because of a fear of falling?  No    Is  the patient reluctant to leave their home because of a fear of falling?  No      Observation/Other Assessments   Focus on Therapeutic Outcomes (FOTO)  62% limitation      Posture/Postural Control   Posture Comments Patient demonstrated rounded shoulder and forward head posture, increased thoracic kyphosis, right shoulder higher than left - patient able to correct when cued      Strength   Overall Strength Comments Periscapular strength grossly 4-/5 MMT assessed in seated position    Right Hip Flexion 4/5    Right Hip Extension 4-/5    Right Hip ABduction 4-/5    Left Hip Flexion 4/5    Left Hip Extension 4-/5    Left Hip ABduction 4-/5    Right Knee Flexion 4+/5    Right Knee Extension 4+/5    Left Knee Flexion 4+/5    Left Knee Extension 4+/5    Right Ankle Dorsiflexion 5/5    Right Ankle Plantar Flexion 4/5    Left Ankle Dorsiflexion 5/5    Left Ankle Plantar Flexion 4/5      Balance  Balance Assessed Yes      Standardized Balance Assessment   Standardized Balance Assessment Five Times Sit to Stand;Timed Up and Go Test    Five times sit to stand comments  13.2 seconds      Timed Up and Go Test   TUG Normal TUG    Normal TUG (seconds) 12.8      High Level Balance   High Level Balance Comments Patient able to maintain SLS < 5 seconds bilaterally                         [facility] Adult PT Treatment/Exercise - 08/05/20 0001      Self-Care   Self-Care Other Self-Care Comments    Other Self-Care Comments  POC update, FOTO      Exercises   Exercises Shoulder;Knee/Hip      Knee/Hip Exercises: Aerobic   Nustep L3 x 4 min (LE and UE)      Knee/Hip Exercises: Standing   Heel Raises 2 sets;10 reps    Heel Raises Limitations 2#    Hip Abduction 2 sets;10 reps    Abduction Limitations 2#    Hip Extension 2 sets;10 reps    Extension Limitations 2#      Knee/Hip Exercises: Seated   Sit to Sand 2 sets;10 reps   without UE support     Knee/Hip Exercises:  Supine   Bridges 2 sets;10 reps    Straight Leg Raises 2 sets;10 reps      Shoulder Exercises: Seated   Horizontal ABduction 10 reps   2 sets   Theraband Level (Shoulder Horizontal ABduction) Level 1 (Yellow)      Shoulder Exercises: Standing   Extension 15 reps   2 sets   Theraband Level (Shoulder Extension) Level 2 (Red)    Row 15 reps   2 sets   Theraband Level (Shoulder Row) Level 2 (Red)      Shoulder Exercises: Stretch   Other Shoulder Stretches Sidelying thoracic rotation with HBH 5 x 10 sec each                  PT Education - 08/05/20 1223    Education Details HEP, POC update    Person(s) Educated Patient    Methods Explanation;Demonstration;Verbal cues    Comprehension Verbalized understanding;Need further instruction;Returned demonstration;Verbal cues required            PT Short Term Goals - 07/29/20 1240      PT SHORT TERM GOAL #1   Title Patient will be I with initial HEP to progress in PT    Time 3    Period Weeks    Status Achieved    Target Date 07/21/20             PT Long Term Goals - 08/05/20 1312      PT LONG TERM GOAL #1   Title Patient will be I with final HEP to maintain progress from PT    Baseline Patient continues to require cueing for HEP    Time 6    Period Weeks    Status On-going    Target Date 09/16/20      PT LONG TERM GOAL #2   Title Patient will demonstrate improved 5xSTS to </= 12 seconds to improve strength and reduce fall risk    Baseline Patient achieved 14 second goal so revised    Time 6    Period Weeks    Status Revised  Target Date 09/16/20      PT LONG TERM GOAL #3   Title Patient will demonsrtated improved TUG </= 10 seconds to reduce fall risk    Baseline Patient achieved 13 second goal so revised    Time 6    Period Weeks    Status Revised    Target Date 09/16/20      PT LONG TERM GOAL #4   Title Patient will exhibit improved gross BLE strength to >/= 4+/5 MMT in order to improve walking  tolerance and stairs    Baseline Patient continues to exhibit gross strength deficits    Time 6    Period Weeks    Status On-going    Target Date 09/16/20      PT LONG TERM GOAL #5   Title Patient will report improved functional level to </= 58% limitation on FOTO    Baseline 62% limitation    Time 6    Period Weeks    Status On-going    Target Date 09/16/20                 Plan - 08/05/20 1256    Clinical Impression Statement Patient therapy tolerated well with no adverse effects. Patient denied any pain with therapy this visit but did report fatigue following exercises. Therapy continued to focus on improving posture and standing stability through periscapular and general LE strengthening. Patient does require consistent cueing throughout exercises for proper technique and maintaing good posture. She does continue to report the feeling of being "crooked" and she likely has scoliotic changes contributing to this but she is able to correct posture well when cued and reports feeling better when she does this. She would benefit from continued skilled PT to improve her posture, balance and walking ability in order to maximize functional level.    Rehab Potential Poor    PT Frequency 1x / week    PT Duration 6 weeks    PT Treatment/Interventions ADLs/Self Care Home Management;Neuromuscular re-education;Balance training;Therapeutic exercise;Therapeutic activities;Functional mobility training;Stair training;Gait training;Patient/family education;Manual techniques;Dry needling;Passive range of motion;Spinal Manipulations;Joint Manipulations;Taping;Cryotherapy;Electrical Stimulation;Moist Heat    PT Next Visit Plan Review HEP and progress PRN, general BLE and postural strengthening, balance training, thoracic mobility    PT Home Exercise Plan [email or web address]: LAQ, sit<>stand, standing hip abduction, extension, marching, hamstring curl, heel raises, tandem balance, banded row with red, supine horiz  abd with yellow, sidelying thoracic rotatation    Consulted and Agree with Plan of Care Patient           Patient will benefit from skilled therapeutic intervention in order to improve the following deficits and impairments:  Abnormal gait, Difficulty walking, Postural dysfunction, Decreased strength, Decreased balance, Decreased range of motion  Visit Diagnosis: Other abnormalities of gait and mobility  Muscle weakness (generalized)  Abnormal posture     Problem List Patient Active Problem List   Diagnosis Date Noted  . Nonrheumatic aortic valve stenosis 05/30/2017  . COPD, mild ([place]) 06/05/2014    Hilda Blades, PT, DPT, LAT, ATC 08/05/20  1:30 PM Phone: [phone] Fax: [place] [facility] [address] [place], [place], [address] Phone: [phone]   Fax:  [phone]  Name: Valerie Villa MRN: [id number] Date of Birth: 1924/01/04

## 2020-08-05 NOTE — Patient Instructions (Signed)
Access Code: [email or web address] URL: [email or web address] Date: 08/05/2020 Prepared by: Hilda Blades  Exercises Seated Long Arc Quad - 1 x daily - 7 x weekly - 2 sets - 10 reps Sit to Stand - 1 x daily - 7 x weekly - 2 sets - 10 reps Standing Hip Abduction with Counter Support - 1 x daily - 7 x weekly - 2 sets - 10 reps Standing Hip Extension with Counter Support - 1 x daily - 7 x weekly - 2 sets - 10 reps Standing March with Counter Support - 1 x daily - 7 x weekly - 2 sets - 10 reps Standing Knee Flexion with Counter Support - 1 x daily - 7 x weekly - 2 sets - 10 reps Heel rises with counter support - 1 x daily - 7 x weekly - 2 sets - 10 reps Standing Tandem Balance with Counter Support - 1 x daily - 7 x weekly - 2 sets - 30 seconds hold Standing Row with Anchored Resistance - 1 x daily - 7 x weekly - 2 sets - 10 reps Sidelying Thoracic and Shoulder Rotation - 1 x daily - 7 x weekly - 5 reps - 10 seconds hold Supine Shoulder Horizontal Abduction with Resistance - 1 x daily - 7 x weekly - 2 sets - 10 reps

## 2020-08-11 ENCOUNTER — Encounter: Payer: PPO | Admitting: Physical Therapy

## 2020-08-20 ENCOUNTER — Other Ambulatory Visit: Payer: Self-pay

## 2020-08-20 ENCOUNTER — Ambulatory Visit: Payer: PPO | Admitting: Physical Therapy

## 2020-08-20 ENCOUNTER — Encounter: Payer: Self-pay | Admitting: Physical Therapy

## 2020-08-20 DIAGNOSIS — R2689 Other abnormalities of gait and mobility: Secondary | ICD-10-CM | POA: Diagnosis not present

## 2020-08-20 DIAGNOSIS — M6281 Muscle weakness (generalized): Secondary | ICD-10-CM

## 2020-08-20 DIAGNOSIS — R293 Abnormal posture: Secondary | ICD-10-CM

## 2020-08-20 NOTE — Patient Instructions (Signed)
Access Code: [email or web address] URL: [email or web address] Date: 08/20/2020 Prepared by: Hilda Blades  Exercises Seated Long Arc Quad - 1 x daily - 7 x weekly - 2 sets - 10 reps Sit to Stand - 1 x daily - 7 x weekly - 2 sets - 10 reps Standing Hip Abduction with Counter Support - 1 x daily - 7 x weekly - 2 sets - 10 reps Standing Hip Extension with Counter Support - 1 x daily - 7 x weekly - 2 sets - 10 reps Standing March with Counter Support - 1 x daily - 7 x weekly - 2 sets - 10 reps Standing Knee Flexion with Counter Support - 1 x daily - 7 x weekly - 2 sets - 10 reps Heel rises with counter support - 1 x daily - 7 x weekly - 2 sets - 10 reps Standing Tandem Balance with Counter Support - 1 x daily - 7 x weekly - 2 sets - 30 seconds hold Standing Row with Anchored Resistance - 1 x daily - 7 x weekly - 2 sets - 10 reps Sidelying Thoracic and Shoulder Rotation - 1 x daily - 7 x weekly - 5 reps - 10 seconds hold Supine Shoulder Horizontal Abduction with Resistance - 1 x daily - 7 x weekly - 2 sets - 10 reps

## 2020-08-20 NOTE — Therapy (Addendum)
[place], [place], [address] Phone: [phone]   Fax:  [phone]  Physical Therapy Treatment / Discharge  Patient Details  Name: Valerie Villa MRN: [id number] Date of Birth: 02-16-1924 Referring Provider (PT): Lajean Manes, MD   Encounter Date: 08/20/2020   PT End of Session - 08/20/20 1133    Visit Number 5    Number of Visits 10    Date for PT Re-Evaluation 09/16/20    Authorization Type [employer]    PT Start Time 1130    PT Stop Time 1210    PT Time Calculation (min) 40 min    Activity Tolerance Patient tolerated treatment well    Behavior During Therapy [facility] for tasks assessed/performed           Past Medical History:  Diagnosis Date  . Colon polyps   . Hearing impairment   . Hiatal hernia   . Hx of basal cell carcinoma   . Hypercholesteremia   . Hypertension   . Mild aortic stenosis   . Osteoporosis   . Seasonal allergies     History reviewed. No pertinent surgical history.  There were no vitals filed for this visit.   Subjective Assessment - 08/20/20 1131    Subjective Patient reports she just got back from the beach and she did not used the elevator, she climbed the steps the whole time.    Patient Stated Goals Improve balance and walking    Currently in Pain? No/denies              [facility] PT Assessment - 08/20/20 0001      Standardized Balance Assessment   Standardized Balance Assessment Five Times Sit to Stand    Five times sit to stand comments  12.6 seconds                         [facility] Adult PT Treatment/Exercise - 08/20/20 0001      Neuro Re-ed    Neuro Re-ed Details  Romberg stance on Airex with head turns and head knods 2 x 30 sec each      Exercises   Exercises Shoulder;Knee/Hip      Knee/Hip Exercises: Aerobic   Nustep L5 x 6 min (LE and UE)      Knee/Hip Exercises: Standing   Heel Raises 2 sets;20 reps    Heel Raises  Limitations 3#    Knee Flexion 2 sets;10 reps    Knee Flexion Limitations 3#    Hip Abduction 2 sets;10 reps    Abduction Limitations 3#      Knee/Hip Exercises: Seated   Long Arc Quad 2 sets;15 reps    Long Arc Quad Weight 3 lbs.    Marching 2 sets;20 reps    Marching Weights 3 lbs.    Sit to Sand 3 sets;10 reps   without UE support     Shoulder Exercises: Standing   Extension 15 reps   2 sets   Theraband Level (Shoulder Extension) Level 2 (Red)    Row 15 reps   2 sets   Theraband Level (Shoulder Row) Level 2 (Red)                  PT Education - 08/20/20 1132    Education Details HEP    Person(s) Educated Patient    Methods Explanation;Demonstration;Verbal cues    Comprehension Verbalized understanding;Returned demonstration;Verbal cues required;Need further instruction  PT Short Term Goals - 07/29/20 1240      PT SHORT TERM GOAL #1   Title Patient will be I with initial HEP to progress in PT    Time 3    Period Weeks    Status Achieved    Target Date 07/21/20             PT Long Term Goals - 08/05/20 1312      PT LONG TERM GOAL #1   Title Patient will be I with final HEP to maintain progress from PT    Baseline Patient continues to require cueing for HEP    Time 6    Period Weeks    Status On-going    Target Date 09/16/20      PT LONG TERM GOAL #2   Title Patient will demonstrate improved 5xSTS to </= 12 seconds to improve strength and reduce fall risk    Baseline Patient achieved 14 second goal so revised    Time 6    Period Weeks    Status Revised    Target Date 09/16/20      PT LONG TERM GOAL #3   Title Patient will demonsrtated improved TUG </= 10 seconds to reduce fall risk    Baseline Patient achieved 13 second goal so revised    Time 6    Period Weeks    Status Revised    Target Date 09/16/20      PT LONG TERM GOAL #4   Title Patient will exhibit improved gross BLE strength to >/= 4+/5 MMT in order to improve walking  tolerance and stairs    Baseline Patient continues to exhibit gross strength deficits    Time 6    Period Weeks    Status On-going    Target Date 09/16/20      PT LONG TERM GOAL #5   Title Patient will report improved functional level to </= 58% limitation on FOTO    Baseline 62% limitation    Time 6    Period Weeks    Status On-going    Target Date 09/16/20                 Plan - 08/20/20 1133    Clinical Impression Statement Patient therapy tolerated well with no adverse effects. Therapy continued to focus on strengthening and postural control. Progressed balance to unstable surface with good tolerance. No change to current HEP. She would benefit from continued skilled PT to improve her posture, balance and walking ability in order to maximize functional level.    PT Treatment/Interventions ADLs/Self Care Home Management;Neuromuscular re-education;Balance training;Therapeutic exercise;Therapeutic activities;Functional mobility training;Stair training;Gait training;Patient/family education;Manual techniques;Dry needling;Passive range of motion;Spinal Manipulations;Joint Manipulations;Taping;Cryotherapy;Electrical Stimulation;Moist Heat    PT Next Visit Plan Review HEP and progress PRN, general BLE and postural strengthening, balance training, thoracic mobility    PT Home Exercise Plan [id number]: LAQ, sit<>stand, standing hip abduction, extension, marching, hamstring curl, heel raises, tandem balance, banded row with red, supine horiz abd with yellow, sidelying thoracic rotatation    Consulted and Agree with Plan of Care Patient           Patient will benefit from skilled therapeutic intervention in order to improve the following deficits and impairments:  Abnormal gait, Difficulty walking, Postural dysfunction, Decreased strength, Decreased balance, Decreased range of motion  Visit Diagnosis: Other abnormalities of gait and mobility  Muscle weakness (generalized)  Abnormal  posture     Problem List Patient Active Problem List  Diagnosis Date Noted  . Nonrheumatic aortic valve stenosis 05/30/2017  . COPD, mild ([place]) 06/05/2014    Hilda Blades, PT, DPT, LAT, ATC 08/20/20  12:15 PM Phone: [phone] Fax: [facility] [address] [place], [place], [address] Phone: [phone]   Fax:  [phone]  Name: Valerie Villa MRN: [id number] Date of Birth: Aug 13, 1924    PHYSICAL THERAPY DISCHARGE SUMMARY  Visits from Start of Care: 5  Current functional level related to goals / functional outcomes: See above   Remaining deficits: See above   Education / Equipment: HEP Plan: Patient agrees to discharge.  Patient goals were partially met. Patient is being discharged due to the patient's request.  ?????   - financial  Hilda Blades, PT, DPT, LAT, ATC 09/16/20  4:36 PM Phone: [phone] Fax: [phone]

## 2020-08-26 ENCOUNTER — Encounter: Payer: PPO | Admitting: Physical Therapy

## 2020-09-02 ENCOUNTER — Encounter: Payer: PPO | Admitting: Physical Therapy

## 2020-09-02 DIAGNOSIS — I129 Hypertensive chronic kidney disease with stage 1 through stage 4 chronic kidney disease, or unspecified chronic kidney disease: Secondary | ICD-10-CM | POA: Diagnosis not present

## 2020-09-02 DIAGNOSIS — N1831 Chronic kidney disease, stage 3a: Secondary | ICD-10-CM | POA: Diagnosis not present

## 2020-09-02 DIAGNOSIS — R234 Changes in skin texture: Secondary | ICD-10-CM | POA: Diagnosis not present

## 2020-09-02 DIAGNOSIS — E441 Mild protein-calorie malnutrition: Secondary | ICD-10-CM | POA: Diagnosis not present

## 2020-09-10 ENCOUNTER — Encounter: Payer: PPO | Admitting: Physical Therapy

## 2020-09-16 ENCOUNTER — Encounter: Payer: Self-pay | Admitting: Physical Therapy

## 2020-10-09 DIAGNOSIS — Z85828 Personal history of other malignant neoplasm of skin: Secondary | ICD-10-CM | POA: Diagnosis not present

## 2020-10-09 DIAGNOSIS — L820 Inflamed seborrheic keratosis: Secondary | ICD-10-CM | POA: Diagnosis not present

## 2020-11-25 DIAGNOSIS — H353131 Nonexudative age-related macular degeneration, bilateral, early dry stage: Secondary | ICD-10-CM | POA: Diagnosis not present

## 2020-11-25 DIAGNOSIS — H5211 Myopia, right eye: Secondary | ICD-10-CM | POA: Diagnosis not present

## 2020-11-25 DIAGNOSIS — H26493 Other secondary cataract, bilateral: Secondary | ICD-10-CM | POA: Diagnosis not present

## 2020-11-25 DIAGNOSIS — H524 Presbyopia: Secondary | ICD-10-CM | POA: Diagnosis not present

## 2020-12-03 DIAGNOSIS — R55 Syncope and collapse: Secondary | ICD-10-CM | POA: Diagnosis not present

## 2020-12-03 DIAGNOSIS — J449 Chronic obstructive pulmonary disease, unspecified: Secondary | ICD-10-CM | POA: Diagnosis not present

## 2020-12-03 DIAGNOSIS — E441 Mild protein-calorie malnutrition: Secondary | ICD-10-CM | POA: Diagnosis not present

## 2020-12-11 DIAGNOSIS — L080 Pyoderma: Secondary | ICD-10-CM | POA: Diagnosis not present

## 2020-12-11 DIAGNOSIS — L720 Epidermal cyst: Secondary | ICD-10-CM | POA: Diagnosis not present

## 2020-12-11 DIAGNOSIS — Z85828 Personal history of other malignant neoplasm of skin: Secondary | ICD-10-CM | POA: Diagnosis not present

## 2020-12-11 DIAGNOSIS — L309 Dermatitis, unspecified: Secondary | ICD-10-CM | POA: Diagnosis not present

## 2020-12-11 DIAGNOSIS — D485 Neoplasm of uncertain behavior of skin: Secondary | ICD-10-CM | POA: Diagnosis not present

## 2020-12-23 DIAGNOSIS — E7800 Pure hypercholesterolemia, unspecified: Secondary | ICD-10-CM | POA: Diagnosis not present

## 2020-12-23 DIAGNOSIS — I129 Hypertensive chronic kidney disease with stage 1 through stage 4 chronic kidney disease, or unspecified chronic kidney disease: Secondary | ICD-10-CM | POA: Diagnosis not present

## 2020-12-23 DIAGNOSIS — I700 Atherosclerosis of aorta: Secondary | ICD-10-CM | POA: Diagnosis not present

## 2020-12-23 DIAGNOSIS — J449 Chronic obstructive pulmonary disease, unspecified: Secondary | ICD-10-CM | POA: Diagnosis not present

## 2020-12-23 DIAGNOSIS — E441 Mild protein-calorie malnutrition: Secondary | ICD-10-CM | POA: Diagnosis not present

## 2020-12-23 DIAGNOSIS — I519 Heart disease, unspecified: Secondary | ICD-10-CM | POA: Diagnosis not present

## 2020-12-23 DIAGNOSIS — N1831 Chronic kidney disease, stage 3a: Secondary | ICD-10-CM | POA: Diagnosis not present

## 2021-01-02 DIAGNOSIS — I129 Hypertensive chronic kidney disease with stage 1 through stage 4 chronic kidney disease, or unspecified chronic kidney disease: Secondary | ICD-10-CM | POA: Diagnosis not present

## 2021-01-02 DIAGNOSIS — N1831 Chronic kidney disease, stage 3a: Secondary | ICD-10-CM | POA: Diagnosis not present

## 2021-01-02 DIAGNOSIS — F0280 Dementia in other diseases classified elsewhere without behavioral disturbance: Secondary | ICD-10-CM | POA: Diagnosis not present

## 2021-01-02 DIAGNOSIS — E441 Mild protein-calorie malnutrition: Secondary | ICD-10-CM | POA: Diagnosis not present

## 2021-01-02 DIAGNOSIS — G309 Alzheimer's disease, unspecified: Secondary | ICD-10-CM | POA: Diagnosis not present

## 2021-03-04 DIAGNOSIS — N1831 Chronic kidney disease, stage 3a: Secondary | ICD-10-CM | POA: Diagnosis not present

## 2021-03-04 DIAGNOSIS — J449 Chronic obstructive pulmonary disease, unspecified: Secondary | ICD-10-CM | POA: Diagnosis not present

## 2021-03-04 DIAGNOSIS — I129 Hypertensive chronic kidney disease with stage 1 through stage 4 chronic kidney disease, or unspecified chronic kidney disease: Secondary | ICD-10-CM | POA: Diagnosis not present

## 2021-03-04 DIAGNOSIS — R197 Diarrhea, unspecified: Secondary | ICD-10-CM | POA: Diagnosis not present

## 2021-03-04 DIAGNOSIS — R252 Cramp and spasm: Secondary | ICD-10-CM | POA: Diagnosis not present

## 2021-03-04 DIAGNOSIS — F0280 Dementia in other diseases classified elsewhere without behavioral disturbance: Secondary | ICD-10-CM | POA: Diagnosis not present

## 2021-03-04 DIAGNOSIS — G309 Alzheimer's disease, unspecified: Secondary | ICD-10-CM | POA: Diagnosis not present

## 2021-03-04 DIAGNOSIS — E441 Mild protein-calorie malnutrition: Secondary | ICD-10-CM | POA: Diagnosis not present

## 2021-03-30 DIAGNOSIS — H353131 Nonexudative age-related macular degeneration, bilateral, early dry stage: Secondary | ICD-10-CM | POA: Diagnosis not present

## 2021-03-30 DIAGNOSIS — H5310 Unspecified subjective visual disturbances: Secondary | ICD-10-CM | POA: Diagnosis not present

## 2021-03-30 DIAGNOSIS — H43813 Vitreous degeneration, bilateral: Secondary | ICD-10-CM | POA: Diagnosis not present

## 2021-04-07 DIAGNOSIS — R29898 Other symptoms and signs involving the musculoskeletal system: Secondary | ICD-10-CM | POA: Diagnosis not present

## 2021-04-07 DIAGNOSIS — I350 Nonrheumatic aortic (valve) stenosis: Secondary | ICD-10-CM | POA: Diagnosis not present

## 2021-04-07 DIAGNOSIS — N1831 Chronic kidney disease, stage 3a: Secondary | ICD-10-CM | POA: Diagnosis not present

## 2021-04-07 DIAGNOSIS — L97311 Non-pressure chronic ulcer of right ankle limited to breakdown of skin: Secondary | ICD-10-CM | POA: Diagnosis not present

## 2021-04-07 DIAGNOSIS — I129 Hypertensive chronic kidney disease with stage 1 through stage 4 chronic kidney disease, or unspecified chronic kidney disease: Secondary | ICD-10-CM | POA: Diagnosis not present

## 2021-04-07 DIAGNOSIS — R55 Syncope and collapse: Secondary | ICD-10-CM | POA: Diagnosis not present

## 2021-04-07 DIAGNOSIS — I872 Venous insufficiency (chronic) (peripheral): Secondary | ICD-10-CM | POA: Diagnosis not present

## 2021-04-07 DIAGNOSIS — E441 Mild protein-calorie malnutrition: Secondary | ICD-10-CM | POA: Diagnosis not present

## 2021-04-07 DIAGNOSIS — Z79899 Other long term (current) drug therapy: Secondary | ICD-10-CM | POA: Diagnosis not present

## 2021-04-07 DIAGNOSIS — R42 Dizziness and giddiness: Secondary | ICD-10-CM | POA: Diagnosis not present

## 2021-04-20 ENCOUNTER — Ambulatory Visit: Payer: PPO | Attending: Internal Medicine

## 2021-04-20 ENCOUNTER — Other Ambulatory Visit: Payer: Self-pay

## 2021-04-20 ENCOUNTER — Other Ambulatory Visit (HOSPITAL_BASED_OUTPATIENT_CLINIC_OR_DEPARTMENT_OTHER): Payer: Self-pay

## 2021-04-20 DIAGNOSIS — Z23 Encounter for immunization: Secondary | ICD-10-CM

## 2021-04-20 MED ORDER — COVID-19 MRNA VACC (MODERNA) 100 MCG/0.5ML IM SUSP
INTRAMUSCULAR | 0 refills | Status: AC
  Filled 2021-04-20: qty 0.25, 1d supply, fill #0

## 2021-04-20 NOTE — Progress Notes (Signed)
   Covid-19 Vaccination Clinic  Name:  Valerie Villa    MRN: [id number] DOB: 1923/10/22  04/20/2021  Valerie Villa was observed post Covid-19 immunization for 15 minutes without incident. She was provided with Vaccine Information Sheet and instruction to access the V-Safe system.   Valerie Villa was instructed to call 911 with any severe reactions post vaccine: Difficulty breathing  Swelling of face and throat  A fast heartbeat  A bad rash all over body  Dizziness and weakness   Immunizations Administered     Name Date Dose VIS Date Route   Moderna Covid-19 Booster Vaccine 04/20/2021 11:37 AM 0.25 mL 07/23/2020 Intramuscular   Manufacturer: Moderna   Lot: [id number]   [place]: [id number]

## 2021-05-14 DIAGNOSIS — C44712 Basal cell carcinoma of skin of right lower limb, including hip: Secondary | ICD-10-CM | POA: Diagnosis not present

## 2021-05-14 DIAGNOSIS — D0439 Carcinoma in situ of skin of other parts of face: Secondary | ICD-10-CM | POA: Diagnosis not present

## 2021-05-14 DIAGNOSIS — Z85828 Personal history of other malignant neoplasm of skin: Secondary | ICD-10-CM | POA: Diagnosis not present

## 2021-05-14 DIAGNOSIS — D485 Neoplasm of uncertain behavior of skin: Secondary | ICD-10-CM | POA: Diagnosis not present

## 2021-06-04 DIAGNOSIS — G453 Amaurosis fugax: Secondary | ICD-10-CM | POA: Diagnosis not present

## 2021-06-04 DIAGNOSIS — Z23 Encounter for immunization: Secondary | ICD-10-CM | POA: Diagnosis not present

## 2021-06-04 DIAGNOSIS — M7061 Trochanteric bursitis, right hip: Secondary | ICD-10-CM | POA: Diagnosis not present

## 2021-06-04 DIAGNOSIS — I129 Hypertensive chronic kidney disease with stage 1 through stage 4 chronic kidney disease, or unspecified chronic kidney disease: Secondary | ICD-10-CM | POA: Diagnosis not present

## 2021-06-04 DIAGNOSIS — R55 Syncope and collapse: Secondary | ICD-10-CM | POA: Diagnosis not present

## 2021-06-11 DIAGNOSIS — Z85828 Personal history of other malignant neoplasm of skin: Secondary | ICD-10-CM | POA: Diagnosis not present

## 2021-06-23 DIAGNOSIS — H524 Presbyopia: Secondary | ICD-10-CM | POA: Diagnosis not present

## 2021-06-23 DIAGNOSIS — H353131 Nonexudative age-related macular degeneration, bilateral, early dry stage: Secondary | ICD-10-CM | POA: Diagnosis not present

## 2021-06-23 DIAGNOSIS — H43813 Vitreous degeneration, bilateral: Secondary | ICD-10-CM | POA: Diagnosis not present

## 2021-06-23 DIAGNOSIS — Z961 Presence of intraocular lens: Secondary | ICD-10-CM | POA: Diagnosis not present

## 2021-06-29 DIAGNOSIS — M25551 Pain in right hip: Secondary | ICD-10-CM | POA: Diagnosis not present

## 2021-07-04 DIAGNOSIS — M810 Age-related osteoporosis without current pathological fracture: Secondary | ICD-10-CM | POA: Diagnosis not present

## 2021-07-04 DIAGNOSIS — F0280 Dementia in other diseases classified elsewhere without behavioral disturbance: Secondary | ICD-10-CM | POA: Diagnosis not present

## 2021-07-04 DIAGNOSIS — I129 Hypertensive chronic kidney disease with stage 1 through stage 4 chronic kidney disease, or unspecified chronic kidney disease: Secondary | ICD-10-CM | POA: Diagnosis not present

## 2021-07-04 DIAGNOSIS — Z9181 History of falling: Secondary | ICD-10-CM | POA: Diagnosis not present

## 2021-07-04 DIAGNOSIS — J449 Chronic obstructive pulmonary disease, unspecified: Secondary | ICD-10-CM | POA: Diagnosis not present

## 2021-07-04 DIAGNOSIS — E7800 Pure hypercholesterolemia, unspecified: Secondary | ICD-10-CM | POA: Diagnosis not present

## 2021-07-04 DIAGNOSIS — I350 Nonrheumatic aortic (valve) stenosis: Secondary | ICD-10-CM | POA: Diagnosis not present

## 2021-07-04 DIAGNOSIS — G309 Alzheimer's disease, unspecified: Secondary | ICD-10-CM | POA: Diagnosis not present

## 2021-07-04 DIAGNOSIS — Z7982 Long term (current) use of aspirin: Secondary | ICD-10-CM | POA: Diagnosis not present

## 2021-07-04 DIAGNOSIS — M7061 Trochanteric bursitis, right hip: Secondary | ICD-10-CM | POA: Diagnosis not present

## 2021-07-04 DIAGNOSIS — N183 Chronic kidney disease, stage 3 unspecified: Secondary | ICD-10-CM | POA: Diagnosis not present

## 2021-07-09 DIAGNOSIS — G309 Alzheimer's disease, unspecified: Secondary | ICD-10-CM | POA: Diagnosis not present

## 2021-07-09 DIAGNOSIS — M7061 Trochanteric bursitis, right hip: Secondary | ICD-10-CM | POA: Diagnosis not present

## 2021-07-09 DIAGNOSIS — Z7982 Long term (current) use of aspirin: Secondary | ICD-10-CM | POA: Diagnosis not present

## 2021-07-09 DIAGNOSIS — M810 Age-related osteoporosis without current pathological fracture: Secondary | ICD-10-CM | POA: Diagnosis not present

## 2021-07-09 DIAGNOSIS — F0280 Dementia in other diseases classified elsewhere without behavioral disturbance: Secondary | ICD-10-CM | POA: Diagnosis not present

## 2021-07-09 DIAGNOSIS — I350 Nonrheumatic aortic (valve) stenosis: Secondary | ICD-10-CM | POA: Diagnosis not present

## 2021-07-09 DIAGNOSIS — Z9181 History of falling: Secondary | ICD-10-CM | POA: Diagnosis not present

## 2021-07-09 DIAGNOSIS — E7800 Pure hypercholesterolemia, unspecified: Secondary | ICD-10-CM | POA: Diagnosis not present

## 2021-07-09 DIAGNOSIS — I129 Hypertensive chronic kidney disease with stage 1 through stage 4 chronic kidney disease, or unspecified chronic kidney disease: Secondary | ICD-10-CM | POA: Diagnosis not present

## 2021-07-09 DIAGNOSIS — N183 Chronic kidney disease, stage 3 unspecified: Secondary | ICD-10-CM | POA: Diagnosis not present

## 2021-07-09 DIAGNOSIS — J449 Chronic obstructive pulmonary disease, unspecified: Secondary | ICD-10-CM | POA: Diagnosis not present

## 2021-07-16 DIAGNOSIS — Z85828 Personal history of other malignant neoplasm of skin: Secondary | ICD-10-CM | POA: Diagnosis not present

## 2021-07-16 DIAGNOSIS — L817 Pigmented purpuric dermatosis: Secondary | ICD-10-CM | POA: Diagnosis not present

## 2021-07-21 DIAGNOSIS — J449 Chronic obstructive pulmonary disease, unspecified: Secondary | ICD-10-CM | POA: Diagnosis not present

## 2021-07-21 DIAGNOSIS — M810 Age-related osteoporosis without current pathological fracture: Secondary | ICD-10-CM | POA: Diagnosis not present

## 2021-07-21 DIAGNOSIS — N183 Chronic kidney disease, stage 3 unspecified: Secondary | ICD-10-CM | POA: Diagnosis not present

## 2021-07-21 DIAGNOSIS — M7061 Trochanteric bursitis, right hip: Secondary | ICD-10-CM | POA: Diagnosis not present

## 2021-07-21 DIAGNOSIS — I350 Nonrheumatic aortic (valve) stenosis: Secondary | ICD-10-CM | POA: Diagnosis not present

## 2021-07-21 DIAGNOSIS — Z9181 History of falling: Secondary | ICD-10-CM | POA: Diagnosis not present

## 2021-07-21 DIAGNOSIS — G309 Alzheimer's disease, unspecified: Secondary | ICD-10-CM | POA: Diagnosis not present

## 2021-07-21 DIAGNOSIS — E7800 Pure hypercholesterolemia, unspecified: Secondary | ICD-10-CM | POA: Diagnosis not present

## 2021-07-21 DIAGNOSIS — I129 Hypertensive chronic kidney disease with stage 1 through stage 4 chronic kidney disease, or unspecified chronic kidney disease: Secondary | ICD-10-CM | POA: Diagnosis not present

## 2021-07-21 DIAGNOSIS — Z7982 Long term (current) use of aspirin: Secondary | ICD-10-CM | POA: Diagnosis not present

## 2021-07-21 DIAGNOSIS — F0280 Dementia in other diseases classified elsewhere without behavioral disturbance: Secondary | ICD-10-CM | POA: Diagnosis not present

## 2021-07-24 DIAGNOSIS — Z7982 Long term (current) use of aspirin: Secondary | ICD-10-CM | POA: Diagnosis not present

## 2021-07-24 DIAGNOSIS — I350 Nonrheumatic aortic (valve) stenosis: Secondary | ICD-10-CM | POA: Diagnosis not present

## 2021-07-24 DIAGNOSIS — F0280 Dementia in other diseases classified elsewhere without behavioral disturbance: Secondary | ICD-10-CM | POA: Diagnosis not present

## 2021-07-24 DIAGNOSIS — I129 Hypertensive chronic kidney disease with stage 1 through stage 4 chronic kidney disease, or unspecified chronic kidney disease: Secondary | ICD-10-CM | POA: Diagnosis not present

## 2021-07-24 DIAGNOSIS — G309 Alzheimer's disease, unspecified: Secondary | ICD-10-CM | POA: Diagnosis not present

## 2021-07-24 DIAGNOSIS — J449 Chronic obstructive pulmonary disease, unspecified: Secondary | ICD-10-CM | POA: Diagnosis not present

## 2021-07-24 DIAGNOSIS — M7061 Trochanteric bursitis, right hip: Secondary | ICD-10-CM | POA: Diagnosis not present

## 2021-07-24 DIAGNOSIS — M810 Age-related osteoporosis without current pathological fracture: Secondary | ICD-10-CM | POA: Diagnosis not present

## 2021-07-24 DIAGNOSIS — N183 Chronic kidney disease, stage 3 unspecified: Secondary | ICD-10-CM | POA: Diagnosis not present

## 2021-07-24 DIAGNOSIS — E7800 Pure hypercholesterolemia, unspecified: Secondary | ICD-10-CM | POA: Diagnosis not present

## 2021-07-24 DIAGNOSIS — Z9181 History of falling: Secondary | ICD-10-CM | POA: Diagnosis not present

## 2021-07-27 DIAGNOSIS — M25551 Pain in right hip: Secondary | ICD-10-CM | POA: Diagnosis not present

## 2021-07-30 ENCOUNTER — Ambulatory Visit
Admission: RE | Admit: 2021-07-30 | Discharge: 2021-07-30 | Disposition: A | Discharge status: Home / Self Care | Payer: PPO | Source: Ambulatory Visit | Attending: Sports Medicine | Admitting: Sports Medicine

## 2021-07-30 ENCOUNTER — Other Ambulatory Visit: Payer: Self-pay

## 2021-07-30 ENCOUNTER — Other Ambulatory Visit: Payer: Self-pay | Admitting: Sports Medicine

## 2021-07-30 DIAGNOSIS — M25551 Pain in right hip: Secondary | ICD-10-CM | POA: Diagnosis not present

## 2021-08-05 DIAGNOSIS — N183 Chronic kidney disease, stage 3 unspecified: Secondary | ICD-10-CM | POA: Diagnosis not present

## 2021-08-05 DIAGNOSIS — J449 Chronic obstructive pulmonary disease, unspecified: Secondary | ICD-10-CM | POA: Diagnosis not present

## 2021-08-05 DIAGNOSIS — Z9181 History of falling: Secondary | ICD-10-CM | POA: Diagnosis not present

## 2021-08-05 DIAGNOSIS — M7061 Trochanteric bursitis, right hip: Secondary | ICD-10-CM | POA: Diagnosis not present

## 2021-08-05 DIAGNOSIS — E7800 Pure hypercholesterolemia, unspecified: Secondary | ICD-10-CM | POA: Diagnosis not present

## 2021-08-05 DIAGNOSIS — Z7982 Long term (current) use of aspirin: Secondary | ICD-10-CM | POA: Diagnosis not present

## 2021-08-05 DIAGNOSIS — G309 Alzheimer's disease, unspecified: Secondary | ICD-10-CM | POA: Diagnosis not present

## 2021-08-05 DIAGNOSIS — M810 Age-related osteoporosis without current pathological fracture: Secondary | ICD-10-CM | POA: Diagnosis not present

## 2021-08-05 DIAGNOSIS — I129 Hypertensive chronic kidney disease with stage 1 through stage 4 chronic kidney disease, or unspecified chronic kidney disease: Secondary | ICD-10-CM | POA: Diagnosis not present

## 2021-08-05 DIAGNOSIS — I350 Nonrheumatic aortic (valve) stenosis: Secondary | ICD-10-CM | POA: Diagnosis not present

## 2021-08-05 DIAGNOSIS — F0280 Dementia in other diseases classified elsewhere without behavioral disturbance: Secondary | ICD-10-CM | POA: Diagnosis not present

## 2021-08-06 DIAGNOSIS — M7061 Trochanteric bursitis, right hip: Secondary | ICD-10-CM | POA: Diagnosis not present

## 2021-08-06 DIAGNOSIS — E7800 Pure hypercholesterolemia, unspecified: Secondary | ICD-10-CM | POA: Diagnosis not present

## 2021-08-06 DIAGNOSIS — I129 Hypertensive chronic kidney disease with stage 1 through stage 4 chronic kidney disease, or unspecified chronic kidney disease: Secondary | ICD-10-CM | POA: Diagnosis not present

## 2021-08-06 DIAGNOSIS — F0280 Dementia in other diseases classified elsewhere without behavioral disturbance: Secondary | ICD-10-CM | POA: Diagnosis not present

## 2021-08-06 DIAGNOSIS — G309 Alzheimer's disease, unspecified: Secondary | ICD-10-CM | POA: Diagnosis not present

## 2021-08-06 DIAGNOSIS — I350 Nonrheumatic aortic (valve) stenosis: Secondary | ICD-10-CM | POA: Diagnosis not present

## 2021-08-06 DIAGNOSIS — J449 Chronic obstructive pulmonary disease, unspecified: Secondary | ICD-10-CM | POA: Diagnosis not present

## 2021-08-06 DIAGNOSIS — M810 Age-related osteoporosis without current pathological fracture: Secondary | ICD-10-CM | POA: Diagnosis not present

## 2021-08-06 DIAGNOSIS — N183 Chronic kidney disease, stage 3 unspecified: Secondary | ICD-10-CM | POA: Diagnosis not present

## 2021-08-06 DIAGNOSIS — Z7982 Long term (current) use of aspirin: Secondary | ICD-10-CM | POA: Diagnosis not present

## 2021-08-06 DIAGNOSIS — Z9181 History of falling: Secondary | ICD-10-CM | POA: Diagnosis not present

## 2021-08-11 ENCOUNTER — Other Ambulatory Visit (HOSPITAL_BASED_OUTPATIENT_CLINIC_OR_DEPARTMENT_OTHER): Payer: Self-pay

## 2021-08-11 ENCOUNTER — Ambulatory Visit: Payer: PPO | Attending: Internal Medicine

## 2021-08-11 DIAGNOSIS — Z23 Encounter for immunization: Secondary | ICD-10-CM

## 2021-08-11 MED ORDER — MODERNA COVID-19 BIVAL BOOSTER 50 MCG/0.5ML IM SUSP
INTRAMUSCULAR | 0 refills | Status: AC
  Filled 2021-08-11: qty 0.5, 1d supply, fill #0

## 2021-08-11 NOTE — Progress Notes (Signed)
   Covid-19 Vaccination Clinic  Name:  GLEE LASHOMB    MRN: [id number] DOB: October 07, 1923  08/11/2021  Ms. Cusic was observed post Covid-19 immunization for 15 minutes without incident. She was provided with Vaccine Information Sheet and instruction to access the V-Safe system.   Ms. Greening was instructed to call 911 with any severe reactions post vaccine: Difficulty breathing  Swelling of face and throat  A fast heartbeat  A bad rash all over body  Dizziness and weakness   Immunizations Administered     Name Date Dose VIS Date Route   Moderna Covid-19 vaccine Bivalent Booster 08/11/2021  3:55 PM 0.5 mL 05/16/2021 Intramuscular   Manufacturer: Moderna   Lot: [id number]   [place]: [id number]

## 2021-10-20 DIAGNOSIS — G309 Alzheimer's disease, unspecified: Secondary | ICD-10-CM | POA: Diagnosis not present

## 2021-10-20 DIAGNOSIS — I129 Hypertensive chronic kidney disease with stage 1 through stage 4 chronic kidney disease, or unspecified chronic kidney disease: Secondary | ICD-10-CM | POA: Diagnosis not present

## 2021-10-20 DIAGNOSIS — N1831 Chronic kidney disease, stage 3a: Secondary | ICD-10-CM | POA: Diagnosis not present

## 2021-10-20 DIAGNOSIS — I350 Nonrheumatic aortic (valve) stenosis: Secondary | ICD-10-CM | POA: Diagnosis not present

## 2021-10-20 DIAGNOSIS — I519 Heart disease, unspecified: Secondary | ICD-10-CM | POA: Diagnosis not present

## 2021-11-23 DIAGNOSIS — M549 Dorsalgia, unspecified: Secondary | ICD-10-CM | POA: Diagnosis not present

## 2021-11-23 DIAGNOSIS — R2689 Other abnormalities of gait and mobility: Secondary | ICD-10-CM | POA: Diagnosis not present

## 2021-11-23 DIAGNOSIS — M79672 Pain in left foot: Secondary | ICD-10-CM | POA: Diagnosis not present

## 2021-12-04 ENCOUNTER — Ambulatory Visit: Payer: PPO | Attending: Geriatric Medicine | Admitting: Physical Therapy

## 2021-12-04 ENCOUNTER — Other Ambulatory Visit: Payer: Self-pay

## 2021-12-04 ENCOUNTER — Encounter: Payer: Self-pay | Admitting: Physical Therapy

## 2021-12-04 DIAGNOSIS — R293 Abnormal posture: Secondary | ICD-10-CM | POA: Diagnosis not present

## 2021-12-04 DIAGNOSIS — R2689 Other abnormalities of gait and mobility: Secondary | ICD-10-CM | POA: Diagnosis not present

## 2021-12-04 DIAGNOSIS — M6281 Muscle weakness (generalized): Secondary | ICD-10-CM | POA: Insufficient documentation

## 2021-12-04 NOTE — Therapy (Signed)
[facility] [address] [place], [place], [address] Phone: [phone]   Fax:  [phone]  Physical Therapy Evaluation  Patient Details  Name: Valerie Villa MRN: [id number] Date of Birth: [birth year] Referring Provider (PT): [place]   Encounter Date: 12/04/2021   PT End of Session - 12/04/21 0934     Visit Number 1    Number of Visits 17    Date for PT Re-Evaluation 01/29/22    Authorization Type HTA    Progress Note Due on Visit 10    PT Start Time 0934    PT Stop Time 1016    PT Time Calculation (min) 42 min    Activity Tolerance Patient tolerated treatment well    Behavior During Therapy [facility] for tasks assessed/performed             Past Medical History:  Diagnosis Date   Colon polyps    Hearing impairment    Hiatal hernia    Hx of basal cell carcinoma    Hypercholesteremia    Hypertension    Mild aortic stenosis    Osteoporosis    Seasonal allergies     History reviewed. No pertinent surgical history.  There were no vitals filed for this visit.    Subjective Assessment - 12/04/21 0938     Subjective Had an episode about 5 weeks ago (not sure if it was a TIA or a dream) and had a fall.  Began having more left lateral lean.  She has been tripping over things and holding to furniture due to fear of falling.  She holds onto caregiver.  Has never used an assistive device.    Patient is accompained by: --   caregiver, Valerie Villa   Patient Stated Goals Pt's goals for therapy                [facility] PT Assessment - 12/04/21 0940       Assessment   Medical Diagnosis debility, gait disorder    Referring Provider (PT) [place]    Onset Date/Surgical Date --   5 weeks ago     Precautions   Precautions Fall      Balance Screen   Has the patient fallen in the past 6 months Yes    How many times? 2   plus caregiver has caught her before a fall all the way to the floor, 7 times   Has the patient had a decrease  in activity level because of a fear of falling?  Yes    Is the patient reluctant to leave their home because of a fear of falling?  Yes      Mobile Private residence    Living Arrangements Alone    Available Help at Discharge --   Caregivers 24/7   Type of [place] to enter    Entrance Stairs-Number of Steps 7    Entrance Stairs-Rails Right    [place] Two level;Able to live on main level with bedroom/bathroom    [place] - single point    Additional Comments Has tried cane, but found it difficult to use      Prior Function   Level of Independence Independent    Leisure Goes to [employer] for fitness and classes      Observation/Other Assessments   Focus on Therapeutic Outcomes (FOTO)  NA      Posture/Postural Control  Posture/Postural Control Postural limitations    Postural Limitations Rounded Shoulders;Forward head;Posterior pelvic tilt;Weight shift left    Posture Comments increased left      ROM / Strength   AROM / PROM / Strength Strength      Strength   Overall Strength Deficits    Strength Assessment Site Hip;Knee;Ankle    Right/Left Hip Right;Left    Right Hip Flexion 4/5    Left Hip Flexion 4/5    Right/Left Knee Right;Left    Right Knee Flexion 3+/5    Right Knee Extension 4+/5    Left Knee Flexion 4+/5    Left Knee Extension 4+/5    Right/Left Ankle Right;Left    Right Ankle Dorsiflexion 3+/5    Left Ankle Dorsiflexion 3+/5      Transfers   Transfers Sit to Stand;Stand to Sit    Sit to Stand 6: Modified independent (Device/Increase time);Without upper extremity assist;From chair/3-in-1    Five time sit to stand comments  20.16    Stand to Sit 6: Modified independent (Device/Increase time);Without upper extremity assist;To chair/3-in-1      Ambulation/Gait   Ambulation/Gait Yes    Ambulation/Gait Assistance 5: Supervision    Assistive device None    Gait Pattern Step-through  pattern;Lateral trunk lean to left;Narrow base of support    Ambulation Surface Level;Indoor    Gait velocity 13.19 sec = 2.49 ft/sec   19.65 sec with rollator trial=1.67 ft/sec     Standardized Balance Assessment   Standardized Balance Assessment Berg Balance Test;Timed Up and Go Test      Berg Balance Test   Sit to Stand Able to stand without using hands and stabilize independently    Standing Unsupported Able to stand safely 2 minutes    Sitting with Back Unsupported but Feet Supported on Floor or Stool Able to sit safely and securely 2 minutes    Stand to Sit Sits safely with minimal use of hands    Transfers Able to transfer safely, minor use of hands    Standing Unsupported with Eyes Closed Able to stand 10 seconds with supervision    Standing Unsupported with Feet Together Able to place feet together independently and stand for 1 minute with supervision    From Standing, Reach Forward with Outstretched Arm Can reach forward >12 cm safely (5")    From Standing Position, Pick up Object from [place] to pick up shoe safely and easily    From Standing Position, Turn to Look Behind Over each Shoulder Looks behind from both sides and weight shifts well    Turn 360 Degrees Able to turn 360 degrees safely but slowly    Standing Unsupported, Alternately Place Feet on Step/Stool Able to complete >2 steps/needs minimal assist    Standing Unsupported, One Foot in Front Able to take small step independently and hold 30 seconds    Standing on One Leg Tries to lift leg/unable to hold 3 seconds but remains standing independently   LLE; unable on RLE   Total Score 43    Berg comment: Scores <45/56 indicate increased fall risk      Timed Up and Go Test   Normal TUG (seconds) 22.84    TUG Comments Scores >13.5 sec indicate increased fall risk.                        Objective measurements completed on examination: See above findings.  PT Education -  12/04/21 1416     Education Details Eval results, POC, possible use of rollator/RW for home safety at night    Person(s) Educated Patient;Caregiver(s)   Valerie Villa   Methods Explanation    Comprehension Verbalized understanding              PT Short Term Goals - 12/04/21 1423       PT SHORT TERM GOAL #1   Title Pt will perform HEP with family supervision for improved strength, balance, posture, and gait.  TARGET 01/01/2022    Time 4    Period Weeks    Status New      PT SHORT TERM GOAL #2   Title Pt will improve 5x sit<>stand to less than or equal to 15 sec to demonstrate improved functional strength and transfer efficiency.    Baseline 20.16 sec    Time 4    Period Weeks    Status New      PT SHORT TERM GOAL #3   Title Pt/caregiver will report at least 50% improvement in posture to improve participation in sitting and standing ADLs.    Baseline increased left lateral lean, with unsupported sitting and standing    Time 4    Period Weeks    Status New               PT Long Term Goals - 12/04/21 1426       PT LONG TERM GOAL #1   Title Pt will perform HEP with family supervision for improved strength, balance, posture, and gait.  TARGET 01/29/2022    Time 8    Period Weeks    Status New      PT LONG TERM GOAL #2   Title Pt will improve Berg score to at least 48/56 to decrease fall risk.    Baseline 43/56    Time 8    Period Weeks    Status New      PT LONG TERM GOAL #3   Title Patient will demonsrtated improved TUG </= 15 seconds to reduce fall risk    Baseline 22.84 sec    Time 8    Period Weeks    Status New      PT LONG TERM GOAL #4   Title Pt will improve gait velocity to at least 2.62 ft/sec for improved gait efficiency and safety, using appropriate device.    Time 8    Period Weeks    Status New                    Plan - 12/04/21 1417     Clinical Impression Statement Pt is a 88 year old female who presents to OPPT with hx of  deconditioning and gait disorder, at least 5 week history of increased falls and increased lateral lean to the left in sitting and standing.  She has had 2 falls in the past 5 weeks, and at least 7 times where caregiver assisted to keep patient from falling.  She is not currently using assistive device.  She presents to OPPT with decreased functional strength, decreased balance, decreased stability and independence with gait, abnormal postureShe is at increased fall risk per Berg, TUG, and FTSTS scores.  She enjoys participating in community exercise classes and has caregiver support for home and community activities.  She will benefit from skilled PT to address the above stated deficits to decrease fall risk and improve overall posture, strength, functional mobility.  Personal Factors and Comorbidities Comorbidity 3+;Time since onset of injury/illness/exacerbation   5 week onset of balance difficulty, postural changes with increased left lateral lean.   Comorbidities See problem list    Examination-Activity Limitations Transfers;Locomotion Level;Stand    Examination-Participation Restrictions Community Activity;Shop    Stability/Clinical Decision Making Evolving/Moderate complexity    Clinical Decision Making Moderate    Rehab Potential Good    PT Frequency 2x / week    PT Duration 8 weeks   plus eval   PT Treatment/Interventions ADLs/Self Care Home Management;DME Instruction;Neuromuscular re-education;Balance training;Therapeutic exercise;Therapeutic activities;Functional mobility training;Gait training;Stair training;Patient/family education;Passive range of motion;Manual techniques    PT Next Visit Plan Initiate HEP to address posture, functional strength, balance.  Trial with RW and discuss how pt can get RW for home/community use    Consulted and Agree with Plan of Care Patient;Family member/caregiver    Family Member Consulted Valerie Villa, caregiver             Patient will benefit from  skilled therapeutic intervention in order to improve the following deficits and impairments:  Abnormal gait, Difficulty walking, Decreased balance, Decreased mobility, Decreased strength, Postural dysfunction  Visit Diagnosis: Other abnormalities of gait and mobility  Abnormal posture  Muscle weakness (generalized)     Problem List Patient Active Problem List   Diagnosis Date Noted   Nonrheumatic aortic valve stenosis 05/30/2017   COPD, mild ([place]) 06/05/2014    Renada Cronin W., PT 12/04/2021, 2:34 PM  [facility] [address] [place], [place], [address] Phone: [phone]   Fax:  [phone]  Name: RANETTE LUCKADOO MRN: [id number] Date of Birth: 11-Jan-1924

## 2021-12-07 DIAGNOSIS — H903 Sensorineural hearing loss, bilateral: Secondary | ICD-10-CM | POA: Diagnosis not present

## 2021-12-09 ENCOUNTER — Other Ambulatory Visit: Payer: Self-pay

## 2021-12-09 ENCOUNTER — Ambulatory Visit: Payer: PPO

## 2021-12-09 DIAGNOSIS — M6281 Muscle weakness (generalized): Secondary | ICD-10-CM

## 2021-12-09 DIAGNOSIS — R293 Abnormal posture: Secondary | ICD-10-CM

## 2021-12-09 DIAGNOSIS — R2689 Other abnormalities of gait and mobility: Secondary | ICD-10-CM | POA: Diagnosis not present

## 2021-12-09 NOTE — Therapy (Signed)
[place] ?[facility] ?[address], [address] ?[place], [place], [address] ?Phone: [phone]   Fax:  [phone] ? ?Physical Therapy Treatment ? ?Patient Details  ?Name: Valerie Villa ?MRN: [id number] ?Date of Birth: 03-22-1924 ?Referring Provider (PT): [facility] ? ? ?Encounter Date: 12/09/2021 ? ? PT End of Session - 12/09/21 0940   ? ? Visit Number 2   ? Number of Visits 17   ? Date for PT Re-Evaluation 01/29/22   ? Authorization Type HTA   ? Progress Note Due on Visit 10   ? PT Start Time 7106   ? PT Stop Time 1015   ? PT Time Calculation (min) 40 min   ? Activity Tolerance Patient tolerated treatment well   ? Behavior During Therapy [facility] for tasks assessed/performed   ? ?  ?  ? ?  ? ? ?Past Medical History:  ?Diagnosis Date  ? Colon polyps   ? Hearing impairment   ? Hiatal hernia   ? Hx of basal cell carcinoma   ? Hypercholesteremia   ? Hypertension   ? Mild aortic stenosis   ? Osteoporosis   ? Seasonal allergies   ? ? ?No past surgical history on file. ? ?There were no vitals filed for this visit. ? ? Subjective Assessment - 12/09/21 0941   ? ? Subjective Have been attending 5 classes at [facility] for balance and aerobic. The left lateral lean is of a new onset in the past 6 weeks. The falls typically occur when walking on the pavement going to the car. May need a walker for bathroom trips at night   ? Patient is accompained by: --   caregiver, Valerie Villa  ? Patient Stated Goals Pt's goals for therapy   ? Currently in Pain? No/denies   ? ?  ?  ? ?  ? ? ? ? ? ? ? ? ? ? ? ? ? ? ? ? ? ? ? ? [place] [facility] PT Treatment/Exercise - 12/09/21 0001   ? ?  ? Ambulation/Gait  ? Ambulation/Gait Yes   ? Ambulation/Gait Assistance 5: Supervision   ? Assistive device Rolling walker   ? Gait Pattern Step-through pattern;Lateral trunk lean to left;Narrow base of support   ?  ? Exercises  ? Exercises Lumbar   ?  ? Lumbar Exercises: Stretches  ? Other Lumbar Stretch Exercise standing and seated side bending stretch  to the right 6x30 sec with mirror for visual feedback   ?  ? Lumbar Exercises: Standing  ? Other Standing Lumbar Exercises Right suitcase carry 4# dumbell 6x25 ft with mirror for visual feedback for midline orientation   ? ?  ?  ? ?  ? ? ? ? ? ? ? ? ? ? ? ? PT Short Term Goals - 12/04/21 1423   ? ?  ? PT SHORT TERM GOAL #1  ? Title Pt will perform HEP with family supervision for improved strength, balance, posture, and gait.  TARGET 01/01/2022   ? Time 4   ? Period Weeks   ? Status New   ?  ? PT SHORT TERM GOAL #2  ? Title Pt will improve 5x sit<>stand to less than or equal to 15 sec to demonstrate improved functional strength and transfer efficiency.   ? Baseline 20.16 sec   ? Time 4   ? Period Weeks   ? Status New   ?  ? PT SHORT TERM GOAL #3  ? Title Pt/caregiver will report at  least 50% improvement in posture to improve participation in sitting and standing ADLs.   ? Baseline increased left lateral lean, with unsupported sitting and standing   ? Time 4   ? Period Weeks   ? Status New   ? ?  ?  ? ?  ? ? ? ? PT Long Term Goals - 12/04/21 1426   ? ?  ? PT LONG TERM GOAL #1  ? Title Pt will perform HEP with family supervision for improved strength, balance, posture, and gait.  TARGET 01/29/2022   ? Time 8   ? Period Weeks   ? Status New   ?  ? PT LONG TERM GOAL #2  ? Title Pt will improve Berg score to at least 48/56 to decrease fall risk.   ? Baseline 43/56   ? Time 8   ? Period Weeks   ? Status New   ?  ? PT LONG TERM GOAL #3  ? Title Patient will demonsrtated improved TUG </= 15 seconds to reduce fall risk   ? Baseline 22.84 sec   ? Time 8   ? Period Weeks   ? Status New   ?  ? PT LONG TERM GOAL #4  ? Title Pt will improve gait velocity to at least 2.62 ft/sec for improved gait efficiency and safety, using appropriate device.   ? Time 8   ? Period Weeks   ? Status New   ? ?  ?  ? ?  ? ? ? ? ? ? ? ? Plan - 12/09/21 1039   ? ? Clinical Impression Statement Tolerating tx sessions well and able to improve midline  orientation with weight/bias placed on right side and use of mirror for visual orientation. Minimal carryover noted when stimulus removed. Good postural correction with tactile cues for lateral shift right. Improved gait balance and stabiliyt using RW vs no AD. Continued sessions to progress HEP for postural/spatial orientation correction and estabilsh least restrictive AD   ? Personal Factors and Comorbidities Comorbidity 3+;Time since onset of injury/illness/exacerbation   5 week onset of balance difficulty, postural changes with increased left lateral lean.  ? Comorbidities See problem list   ? Examination-Activity Limitations Transfers;Locomotion Level;Stand   ? Examination-Participation Restrictions Community Activity;Shop   ? Stability/Clinical Decision Making Evolving/Moderate complexity   ? Rehab Potential Good   ? PT Frequency 2x / week   ? PT Duration 8 weeks   plus eval  ? PT Treatment/Interventions ADLs/Self Care Home Management;DME Instruction;Neuromuscular re-education;Balance training;Therapeutic exercise;Therapeutic activities;Functional mobility training;Gait training;Stair training;Patient/family education;Passive range of motion;Manual techniques   ? PT Next Visit Plan Initiate HEP to address posture, functional strength, balance.  Trial with RW and discuss how pt can get RW for home/community use   ? Consulted and Agree with Plan of Care Patient;Family member/caregiver   ? Family Member Valerie Villa, caregiver   ? ?  ?  ? ?  ? ? ?Patient will benefit from skilled therapeutic intervention in order to improve the following deficits and impairments:  Abnormal gait, Difficulty walking, Decreased balance, Decreased mobility, Decreased strength, Postural dysfunction ? ?Visit Diagnosis: ?Other abnormalities of gait and mobility ? ?Abnormal posture ? ?Muscle weakness (generalized) ? ? ? ? ?Problem List ?Patient Active Problem List  ? Diagnosis Date Noted  ? Nonrheumatic aortic valve stenosis 05/30/2017   ? COPD, mild ([place]) 06/05/2014  ? ? ?Toniann Fail, PT ?12/09/2021, 10:42 AM ? ?[place] ?[facility] ?[address] ?[place], [place], [address] ?Phone: [phone]   Fax:  [phone] ? ?Name: Valerie Villa ?MRN: [id number] ?Date of Birth: Sep 24, 1924 ? ? ? ?

## 2021-12-11 ENCOUNTER — Other Ambulatory Visit: Payer: Self-pay

## 2021-12-11 ENCOUNTER — Ambulatory Visit: Payer: PPO

## 2021-12-11 DIAGNOSIS — M6281 Muscle weakness (generalized): Secondary | ICD-10-CM

## 2021-12-11 DIAGNOSIS — R2689 Other abnormalities of gait and mobility: Secondary | ICD-10-CM | POA: Diagnosis not present

## 2021-12-11 DIAGNOSIS — R293 Abnormal posture: Secondary | ICD-10-CM

## 2021-12-11 NOTE — Therapy (Signed)
[place] ?[facility] ?[address] ?[place], [place], [address] ?Phone: [phone]   Fax:  [phone] ? ?Physical Therapy Treatment ? ?Patient Details  ?Name: Valerie Villa ?MRN: [id number] ?Date of Birth: 1923/12/01 ?Referring Provider (PT): [facility] ? ? ?Encounter Date: 12/11/2021 ? ? PT End of Session - 12/11/21 0937   ? ? Visit Number 3   ? Number of Visits 17   ? Date for PT Re-Evaluation 01/29/22   ? Authorization Type HTA   ? Progress Note Due on Visit 10   ? PT Start Time [phone]   ? PT Stop Time 1015   ? PT Time Calculation (min) 41 min   ? Activity Tolerance Patient tolerated treatment well   ? Behavior During Therapy [facility] for tasks assessed/performed   ? ?  ?  ? ?  ? ? ?Past Medical History:  ?Diagnosis Date  ? Colon polyps   ? Hearing impairment   ? Hiatal hernia   ? Hx of basal cell carcinoma   ? Hypercholesteremia   ? Hypertension   ? Mild aortic stenosis   ? Osteoporosis   ? Seasonal allergies   ? ? ?No past surgical history on file. ? ?There were no vitals filed for this visit. ? ? Subjective Assessment - 12/11/21 0939   ? ? Subjective Pt reports left side neck pain/soreness from her previous exercises, but reports no issue at present.   ? Patient is accompained by: --   caregiver, Valerie Villa  ? Patient Stated Goals Pt's goals for therapy   ? Currently in Pain? No/denies   ? ?  ?  ? ?  ? ? ? ? ? [facility] PT Assessment - 12/11/21 0001   ? ?  ? Assessment  ? Medical Diagnosis debility, gait disorder   ? ?  ?  ? ?  ? ? ? ? ? ? ? ? ? ? ? ? ? ? ? ? [place] Adult PT Treatment/Exercise - 12/11/21 0001   ? ?  ? Lumbar Exercises: Stretches  ? Standing Side Bend Left;5 reps;10 seconds   ? Standing Side Bend Limitations seated Left; 5 reps; 10 sec   ?  ? Lumbar Exercises: Standing  ? Other Standing Lumbar Exercises suitcase march 4# RUE: 1x10 firm, 2x10 on airex pad. Static standing on airex pad feet apart eyes closed 3x30 sec, feet together 3x15 sec eyes open for proprioception   ?  ?  Lumbar Exercises: Seated  ? Long [employer] on Chair Strengthening;Both;3 sets;10 reps   ? Other Seated Lumbar Exercises AP 30x for warm-up   ? Other Seated Lumbar Exercises chin retraction 5x 3 sec hold   ? ?  ?  ? ?  ? ? ? ? ? ? ? ? ? ? ? ? PT Short Term Goals - 12/04/21 1423   ? ?  ? PT SHORT TERM GOAL #1  ? Title Pt will perform HEP with family supervision for improved strength, balance, posture, and gait.  TARGET 01/01/2022   ? Time 4   ? Period Weeks   ? Status New   ?  ? PT SHORT TERM GOAL #2  ? Title Pt will improve 5x sit<>stand to less than or equal to 15 sec to demonstrate improved functional strength and transfer efficiency.   ? Baseline 20.16 sec   ? Time 4   ? Period Weeks   ? Status New   ?  ? PT SHORT TERM GOAL #3  ?  Title Pt/caregiver will report at least 50% improvement in posture to improve participation in sitting and standing ADLs.   ? Baseline increased left lateral lean, with unsupported sitting and standing   ? Time 4   ? Period Weeks   ? Status New   ? ?  ?  ? ?  ? ? ? ? PT Long Term Goals - 12/04/21 1426   ? ?  ? PT LONG TERM GOAL #1  ? Title Pt will perform HEP with family supervision for improved strength, balance, posture, and gait.  TARGET 01/29/2022   ? Time 8   ? Period Weeks   ? Status New   ?  ? PT LONG TERM GOAL #2  ? Title Pt will improve Berg score to at least 48/56 to decrease fall risk.   ? Baseline 43/56   ? Time 8   ? Period Weeks   ? Status New   ?  ? PT LONG TERM GOAL #3  ? Title Patient will demonsrtated improved TUG </= 15 seconds to reduce fall risk   ? Baseline 22.84 sec   ? Time 8   ? Period Weeks   ? Status New   ?  ? PT LONG TERM GOAL #4  ? Title Pt will improve gait velocity to at least 2.62 ft/sec for improved gait efficiency and safety, using appropriate device.   ? Time 8   ? Period Weeks   ? Status New   ? ?  ?  ? ?  ? ? ? ? ? ? ? ? Plan - 12/11/21 1109   ? ? Clinical Impression Statement Tolerating activities well without adverse effect or complaints of increased  discomfort. Tx emphasis on postural re-education using unilateral loading and mirror for visual feedback to reduce her left lateral lean and improve orientation to mid line with occasional tactile cues for increased stimulation. Improve midline posture observed by end of session. Additional of postural chin retractions added to HEP. Continued sessions indicated to progress balance, motor control, and postural alignment to reduce risk for falls   ? Personal Factors and Comorbidities Comorbidity 3+;Time since onset of injury/illness/exacerbation   5 week onset of balance difficulty, postural changes with increased left lateral lean.  ? Comorbidities See problem list   ? Examination-Activity Limitations Transfers;Locomotion Level;Stand   ? Examination-Participation Restrictions Community Activity;Shop   ? Stability/Clinical Decision Making Evolving/Moderate complexity   ? Rehab Potential Good   ? PT Frequency 2x / week   ? PT Duration 8 weeks   plus eval  ? PT Treatment/Interventions ADLs/Self Care Home Management;DME Instruction;Neuromuscular re-education;Balance training;Therapeutic exercise;Therapeutic activities;Functional mobility training;Gait training;Stair training;Patient/family education;Passive range of motion;Manual techniques   ? PT Next Visit Plan Initiate HEP to address posture, functional strength, balance.  Trial with RW and discuss how pt can get RW for home/community use   ? PT Home Exercise Plan suitcase carry/march, sidebending stretch (seated and standing), chin retractions   ? Consulted and Agree with Plan of Care Patient;Family member/caregiver   ? Family Member Valerie Villa, caregiver   ? ?  ?  ? ?  ? ? ?Patient will benefit from skilled therapeutic intervention in order to improve the following deficits and impairments:  Abnormal gait, Difficulty walking, Decreased balance, Decreased mobility, Decreased strength, Postural dysfunction ? ?Visit Diagnosis: ?Other abnormalities of gait and  mobility ? ?Abnormal posture ? ?Muscle weakness (generalized) ? ? ? ? ?Problem List ?Patient Active Problem List  ? Diagnosis Date Noted  ?  Nonrheumatic aortic valve stenosis 05/30/2017  ? COPD, mild ([place]) 06/05/2014  ? ? ?Toniann Fail, PT ?12/11/2021, 11:23 AM ? ?[place] ?[facility] ?[address], [address] ?[place], [place], [address] ?Phone: [phone]   Fax:  [phone] ? ?Name: Valerie Villa ?MRN: [id number] ?Date of Birth: [birth year] ? ? ? ?

## 2021-12-15 ENCOUNTER — Encounter: Payer: Self-pay | Admitting: Physical Therapy

## 2021-12-15 ENCOUNTER — Ambulatory Visit: Payer: PPO | Admitting: Physical Therapy

## 2021-12-15 ENCOUNTER — Other Ambulatory Visit: Payer: Self-pay

## 2021-12-15 DIAGNOSIS — M6281 Muscle weakness (generalized): Secondary | ICD-10-CM

## 2021-12-15 DIAGNOSIS — R2689 Other abnormalities of gait and mobility: Secondary | ICD-10-CM | POA: Diagnosis not present

## 2021-12-15 DIAGNOSIS — R293 Abnormal posture: Secondary | ICD-10-CM

## 2021-12-15 NOTE — Therapy (Signed)
[place] ?[facility] ?[address], [address] ?[place], [place], [address] ?Phone: [phone]   Fax:  [phone] ? ?Physical Therapy Treatment ? ?Patient Details  ?Name: Valerie Villa ?MRN: [id number] ?Date of Birth: 05-22-1924 ?Referring Provider (PT): [place] ? ? ?Encounter Date: 12/15/2021 ? ? PT End of Session - 12/15/21 0937   ? ? Visit Number 4   ? Number of Visits 17   ? Date for PT Re-Evaluation 01/29/22   ? Authorization Type HTA   ? Progress Note Due on Visit 10   ? PT Start Time [phone]   ? PT Stop Time 1015   ? PT Time Calculation (min) 38 min   ? Activity Tolerance Patient tolerated treatment well;Patient limited by fatigue   c/o back pain and neck pain  ? Behavior During Therapy [facility] for tasks assessed/performed   ? ?  ?  ? ?  ? ? ?Past Medical History:  ?Diagnosis Date  ? Colon polyps   ? Hearing impairment   ? Hiatal hernia   ? Hx of basal cell carcinoma   ? Hypercholesteremia   ? Hypertension   ? Mild aortic stenosis   ? Osteoporosis   ? Seasonal allergies   ? ? ?History reviewed. No pertinent surgical history. ? ?There were no vitals filed for this visit. ? ? Subjective Assessment - 12/15/21 0939   ? ? Subjective The lateral stretching over the head reach and stretch towards  R is hurting the neck.  Faithfully doing the exercises.   ? Patient is accompained by: --   caregiver, [place]  ? Patient Stated Goals Pt's goals for therapy are to improve posture, balance.   ? Currently in Pain? Yes   ? Pain Score 6    discomfort in neck after exercises  ? Pain Location Neck   ? Pain Orientation Left   ? Pain Descriptors / Indicators Discomfort   ? Pain Type Acute pain   ? Pain Frequency Intermittent   ? Aggravating Factors  exercises   ? Pain Relieving Factors rubbing, time after exercises, tylenol occasionally   ? ?  ?  ? ?  ? ? ? ? ? ? ? ? ? ? ? ? ? ? ? ? ? ? ? ? [place] Adult PT Treatment/Exercise - 12/15/21 0001   ? ?  ? Posture/Postural Control  ? Posture/Postural Control Postural  limitations   ? Postural Limitations Rounded Shoulders;Forward head;Posterior pelvic tilt;Weight shift left   cervical R lateral flexion  ? Posture Comments increased left lateral lean.  Addressing posture:  seated edge of mat with initial tactile cues and visual cues of mirror, 1 min seated upright x 4 during active rest sessions.   ?  ? Neuro Re-ed   ? Neuro Re-ed Details  Seated edge of mat postural re-ed with red therapy ball beside:  lateral weightshift to roll the ball out to side, 8 reps each side with asisstance.   ?  ? Exercises  ? Exercises Lumbar;Other Exercises   ? Other Exercises  Tactile and visual cues of mirror for upright posture to midline.  In midline, scapular squeezes 2 x 5 reps.  Cervical active gentle ROM:  sidebending x 3 reps, cervical rotation 2 x 2 reps.   ?  ? Lumbar Exercises: Seated  ? Other Seated Lumbar Exercises ant/post pelvic tilts 2 x 10 reps with initial tactile cues   ? ?  ?  ? ?  ? ? ? ? ? ? ? ? ? ?  PT Education - 12/15/21 1121   ? ? Education Details Added to HEP-see instructions   ? Person(s) Educated Associate Professor)   ? Methods Explanation;Demonstration;Verbal cues;Handout   ? Comprehension Verbalized understanding;Returned demonstration;Need further instruction   ? ?  ?  ? ?  ? ? ? PT Short Term Goals - 12/04/21 1423   ? ?  ? PT SHORT TERM GOAL #1  ? Title Pt will perform HEP with family supervision for improved strength, balance, posture, and gait.  TARGET 01/01/2022   ? Time 4   ? Period Weeks   ? Status New   ?  ? PT SHORT TERM GOAL #2  ? Title Pt will improve 5x sit<>stand to less than or equal to 15 sec to demonstrate improved functional strength and transfer efficiency.   ? Baseline 20.16 sec   ? Time 4   ? Period Weeks   ? Status New   ?  ? PT SHORT TERM GOAL #3  ? Title Pt/caregiver will report at least 50% improvement in posture to improve participation in sitting and standing ADLs.   ? Baseline increased left lateral lean, with unsupported sitting and standing    ? Time 4   ? Period Weeks   ? Status New   ? ?  ?  ? ?  ? ? ? ? PT Long Term Goals - 12/04/21 1426   ? ?  ? PT LONG TERM GOAL #1  ? Title Pt will perform HEP with family supervision for improved strength, balance, posture, and gait.  TARGET 01/29/2022   ? Time 8   ? Period Weeks   ? Status New   ?  ? PT LONG TERM GOAL #2  ? Title Pt will improve Berg score to at least 48/56 to decrease fall risk.   ? Baseline 43/56   ? Time 8   ? Period Weeks   ? Status New   ?  ? PT LONG TERM GOAL #3  ? Title Patient will demonsrtated improved TUG </= 15 seconds to reduce fall risk   ? Baseline 22.84 sec   ? Time 8   ? Period Weeks   ? Status New   ?  ? PT LONG TERM GOAL #4  ? Title Pt will improve gait velocity to at least 2.62 ft/sec for improved gait efficiency and safety, using appropriate device.   ? Time 8   ? Period Weeks   ? Status New   ? ?  ?  ? ?  ? ? ? ? ? ? ? ? Plan - 12/15/21 1122   ? ? Clinical Impression Statement Utilized mirror for visual cues and feedback for improved posture.  Pt able to sustain upright posture in midline x (at least) 1 minute, 4 reps in session for sustained postural stregnthening and NMR.  Worked on ant/posteiror and lateral weightshifting to assist with posture, with pt c/o low back pain in R low back in session.  Educated in rest and heat to help relax muscles and to avoid overdoing with HEP.  In sitting, pt's posture continues to improve compared to eval.  She will continue to benefit from further skilled PT to address posture, strength, balance and gait to decrease fall risk and improve overall functional mobility.   ? Personal Factors and Comorbidities Comorbidity 3+;Time since onset of injury/illness/exacerbation   5 week onset of balance difficulty, postural changes with increased left lateral lean.  ? Comorbidities See problem list   ? Examination-Activity Limitations  Transfers;Locomotion Level;Stand   ? Examination-Participation Restrictions Community Activity;Shop   ?  Stability/Clinical Decision Making Evolving/Moderate complexity   ? Rehab Potential Good   ? PT Frequency 2x / week   ? PT Duration 8 weeks   plus eval  ? PT Treatment/Interventions ADLs/Self Care Home Management;DME Instruction;Neuromuscular re-education;Balance training;Therapeutic exercise;Therapeutic activities;Functional mobility training;Gait training;Stair training;Patient/family education;Passive range of motion;Manual techniques   ? PT Next Visit Plan Review cervical A/ROM added to HEP.  Cotninue to work on functional strength, balance.  Trial with RW and discuss how pt can get RW for home/community use   ? PT Home Exercise Plan suitcase carry/march, sidebending stretch (seated and standing), chin retractions   ? Consulted and Agree with Plan of Care Patient;Family member/caregiver   ? Family Member Weldon Picking, caregiver   ? ?  ?  ? ?  ? ? ?Patient will benefit from skilled therapeutic intervention in order to improve the following deficits and impairments:  Abnormal gait, Difficulty walking, Decreased balance, Decreased mobility, Decreased strength, Postural dysfunction ? ?Visit Diagnosis: ?Abnormal posture ? ?Muscle weakness (generalized) ? ? ? ? ?Problem List ?Patient Active Problem List  ? Diagnosis Date Noted  ? Nonrheumatic aortic valve stenosis 05/30/2017  ? COPD, mild ([place]) 06/05/2014  ? ? ?Mirella Gueye W., PT ?12/15/2021, 11:27 AM ? ?[place] ?[facility] ?[address] ?[place], [place], [address] ?Phone: [phone]   Fax:  [phone] ? ?Name: JESSACA PHILIPPI ?MRN: [id number] ?Date of Birth: 03/15/1924 ? ? ? ?

## 2021-12-15 NOTE — Patient Instructions (Signed)
Access Code: [email or web address] ?URL: [email or web address] ?Date: 12/15/2021 ?Prepared by: [facility] ? ?Exercises ?Seated Cervical Rotation AROM - 1 x daily - 5 x weekly - 1 sets - 5 reps ?Seated Cervical Sidebending AROM - 1 x daily - 5 x weekly - 1 sets - 5 reps ? ?

## 2021-12-17 ENCOUNTER — Ambulatory Visit: Payer: PPO | Admitting: Physical Therapy

## 2021-12-22 ENCOUNTER — Ambulatory Visit: Payer: PPO | Admitting: Physical Therapy

## 2021-12-25 ENCOUNTER — Ambulatory Visit: Payer: PPO | Admitting: Physical Therapy

## 2021-12-25 ENCOUNTER — Other Ambulatory Visit: Payer: Self-pay

## 2021-12-25 DIAGNOSIS — M6281 Muscle weakness (generalized): Secondary | ICD-10-CM

## 2021-12-25 DIAGNOSIS — R2689 Other abnormalities of gait and mobility: Secondary | ICD-10-CM | POA: Diagnosis not present

## 2021-12-25 DIAGNOSIS — R293 Abnormal posture: Secondary | ICD-10-CM

## 2021-12-25 NOTE — Therapy (Signed)
[place] ?[facility] ?[address], [address] ?[place], [place], [address] ?Phone: [phone]   Fax:  [phone] ? ?Physical Therapy Treatment ? ?Patient Details  ?Name: Valerie Villa ?MRN: [id number] ?Date of Birth: 30-Nov-1923 ?Referring Provider (PT): [place] ? ? ?Encounter Date: 12/25/2021 ? ? PT End of Session - 12/25/21 1042   ? ? Visit Number 5   ? Number of Visits 17   ? Date for PT Re-Evaluation 01/29/22   ? Authorization Type HTA   ? Progress Note Due on Visit 10   ? PT Start Time [phone]   ? PT Stop Time 1018   ? PT Time Calculation (min) 42 min   ? Activity Tolerance Patient tolerated treatment well   ? Behavior During Therapy [facility] for tasks assessed/performed   ? ?  ?  ? ?  ? ? ?Past Medical History:  ?Diagnosis Date  ? Colon polyps   ? Hearing impairment   ? Hiatal hernia   ? Hx of basal cell carcinoma   ? Hypercholesteremia   ? Hypertension   ? Mild aortic stenosis   ? Osteoporosis   ? Seasonal allergies   ? ? ?No past surgical history on file. ? ?There were no vitals filed for this visit. ? ? Subjective Assessment - 12/25/21 0941   ? ? Subjective Had some sinus/congestion things going on.  Everyone is saying that my posture is better.   ? Patient is accompained by: Family member   daughter, Eustaquio Maize  ? Patient Stated Goals Pt's goals for therapy are to improve posture, balance.   ? Currently in Pain? No/denies   ? ?  ?  ? ?  ? ? ? ? ? ? ? ? ? ? ?Access Code: [email or web address] ?URL: [email or web address] ?Date: 12/25/2021 ?Prepared by: [facility] ? ?Exercises-Reviewed HEP given last visit, with pt return demo understanding.  Provided reminder cues that stretches/ROM should be gentle and not increase pain. ?- Seated Cervical Rotation AROM  - 1 x daily - 5 x weekly - 1 sets - 5 reps ?- Seated Cervical Sidebending AROM  - 1 x daily - 5 x weekly - 1 sets - 5 reps ? ? ? ? ? ? ? ? ? [place] Adult PT Treatment/Exercise - 12/25/21 0001   ? ?  ?  Transfers  ? Transfers Sit to Stand;Stand to Sit   ? Sit to Stand 6: Modified independent (Device/Increase time);Without upper extremity assist;From bed   ? Stand to Sit 6: Modified independent (Device/Increase time);Without upper extremity assist;To bed   ? Number of Reps 1 set;Other reps (comment)   5 reps  ?  ? Posture/Postural Control  ? Posture/Postural Control Postural limitations   ? Postural Limitations Rounded Shoulders;Forward head;Posterior pelvic tilt;Weight shift left   *L* lateral cervical flexion  ? Posture Comments Left lateral lean, able to be corrected with exercises and with light tactile cues..  Addressing posture:  seated edge of mat with initial tactile cues 1 min seated upright during active rest sessions between activities.   ?  ? Exercises  ? Exercises Lumbar;Other Exercises   ? Other Exercises  Seated:  Forward lean to upright posture, 2 x 5 reps.  Lateral reach side to side to targets x 5 reps.  Seated LAQ, marching, ankle pumps x 10 reps prior to standing.  Seated in chair at wall, pillow at back, towel roll behind head, to encourage neck retraction; cues to lessen  L lateral lean and L lateral flexion of c-spine   ?  ? Lumbar Exercises: Standing  ? Other Standing Lumbar Exercises Wide BOS lateral weightshifting with reach to targets at cabinets, x 10 reps, cues to look up at targets.  Stagger stance foot position anterior/posterior rocking to encourage plantar flexion/dorsiflexion through lower legs and weightshift thorugh hips.   ? ?  ?  ? ?  ? ? ? ? ? ? ? ? ? ? PT Education - 12/25/21 1041   ? ? Education Details Additions to HEP   ? Person(s) Educated Patient;Child(ren)   ? Methods Explanation;Demonstration;Verbal cues;Handout;Tactile cues   ? Comprehension Verbalized understanding;Returned demonstration;Need further instruction   ? ?  ?  ? ?  ? ? ? PT Short Term Goals - 12/04/21 1423   ? ?  ? PT SHORT TERM GOAL #1  ? Title Pt will perform HEP with family supervision for improved  strength, balance, posture, and gait.  TARGET 01/01/2022   ? Time 4   ? Period Weeks   ? Status New   ?  ? PT SHORT TERM GOAL #2  ? Title Pt will improve 5x sit<>stand to less than or equal to 15 sec to demonstrate improved functional strength and transfer efficiency.   ? Baseline 20.16 sec   ? Time 4   ? Period Weeks   ? Status New   ?  ? PT SHORT TERM GOAL #3  ? Title Pt/caregiver will report at least 50% improvement in posture to improve participation in sitting and standing ADLs.   ? Baseline increased left lateral lean, with unsupported sitting and standing   ? Time 4   ? Period Weeks   ? Status New   ? ?  ?  ? ?  ? ? ? ? PT Long Term Goals - 12/04/21 1426   ? ?  ? PT LONG TERM GOAL #1  ? Title Pt will perform HEP with family supervision for improved strength, balance, posture, and gait.  TARGET 01/29/2022   ? Time 8   ? Period Weeks   ? Status New   ?  ? PT LONG TERM GOAL #2  ? Title Pt will improve Berg score to at least 48/56 to decrease fall risk.   ? Baseline 43/56   ? Time 8   ? Period Weeks   ? Status New   ?  ? PT LONG TERM GOAL #3  ? Title Patient will demonsrtated improved TUG </= 15 seconds to reduce fall risk   ? Baseline 22.84 sec   ? Time 8   ? Period Weeks   ? Status New   ?  ? PT LONG TERM GOAL #4  ? Title Pt will improve gait velocity to at least 2.62 ft/sec for improved gait efficiency and safety, using appropriate device.   ? Time 8   ? Period Weeks   ? Status New   ? ?  ?  ? ?  ? ? ? ? ? ? ? ? Plan - 12/25/21 1042   ? ? Clinical Impression Statement Pt has missed several visits due to not feeling well, returns today to PT session with no c/o pain and improved seated posture.  Daughter is present for the weekend from out of town and reports a noticeable improvement in her posture.  Pt tolerates standing and seated exercises well without c/o.  Used more visual cues from therapist, and light tactile cues to lessen Left lateral lean, and  pt responds well to these.  She will continue to benefit  from skilled PT towards improved posture, functional mobility and balance for overall improved independence and safety with functional mboiltiy.   ? Personal Factors and Comorbidities Comorbidity 3+;Time since onset of injury/illness/exacerbation   5 week onset of balance difficulty, postural changes with increased left lateral lean.  ? Comorbidities See problem list   ? Examination-Activity Limitations Transfers;Locomotion Level;Stand   ? Examination-Participation Restrictions Community Activity;Shop   ? Stability/Clinical Decision Making Evolving/Moderate complexity   ? Rehab Potential Good   ? PT Frequency 2x / week   ? PT Duration 8 weeks   plus eval  ? PT Treatment/Interventions ADLs/Self Care Home Management;DME Instruction;Neuromuscular re-education;Balance training;Therapeutic exercise;Therapeutic activities;Functional mobility training;Gait training;Stair training;Patient/family education;Passive range of motion;Manual techniques   ? PT Next Visit Plan Check STGs and ADD APPOINTMENTS.  Review additions to HEP.  Cotninue to work on functional strength, balance.  Trial with RW and discuss how pt can get RW for home/community use   ? PT Home Exercise Plan suitcase carry/march, sidebending stretch (seated and standing), chin retractions   ? Consulted and Agree with Plan of Care Patient;Family member/caregiver   ? Family Member Weldon Picking, caregiver   ? ?  ?  ? ?  ? ? ?Patient will benefit from skilled therapeutic intervention in order to improve the following deficits and impairments:  Abnormal gait, Difficulty walking, Decreased balance, Decreased mobility, Decreased strength, Postural dysfunction ? ?Visit Diagnosis: ?Abnormal posture ? ?Muscle weakness (generalized) ? ? ? ? ?Problem List ?Patient Active Problem List  ? Diagnosis Date Noted  ? Nonrheumatic aortic valve stenosis 05/30/2017  ? COPD, mild ([place]) 06/05/2014  ? ? ?Mescal Flinchbaugh W., PT ?12/25/2021, 10:46 AM ? ?[place] ?[facility] ?[address], [address] ?[place], [place], [address] ?Phone: [phone]   Fax:  [phone] ? ?Name: Valerie Villa ?MRN: [id number] ?Date of Birth: 03-Dec-1923 ? ? ? ?

## 2021-12-25 NOTE — Patient Instructions (Signed)
Access Code: [email or web address] ?URL: [email or web address] ?Date: 12/25/2021 ?Prepared by: [facility] ? ?Exercises ?- Seated Cervical Rotation AROM  - 1 x daily - 5 x weekly - 1 sets - 5 reps ?- Seated Cervical Sidebending AROM  - 1 x daily - 5 x weekly - 1 sets - 5 reps ? ?Added 12/25/2021 (Discussed performing HEP in small bouts throughout the day as postural strengthening reminders) ?- Seated Active Hip Flexion  - 1 x daily - 5 x weekly - 2 sets - 5 reps ?- Lateral Weight Shift with Arm Raise  - 1 x daily - 5 x weekly - 1-2 sets - 10 reps ? ?

## 2021-12-28 ENCOUNTER — Ambulatory Visit: Payer: PPO | Admitting: Physical Therapy

## 2021-12-28 ENCOUNTER — Encounter: Payer: Self-pay | Admitting: Physical Therapy

## 2021-12-28 ENCOUNTER — Other Ambulatory Visit: Payer: Self-pay

## 2021-12-28 DIAGNOSIS — R2689 Other abnormalities of gait and mobility: Secondary | ICD-10-CM | POA: Diagnosis not present

## 2021-12-28 DIAGNOSIS — R293 Abnormal posture: Secondary | ICD-10-CM

## 2021-12-28 DIAGNOSIS — M6281 Muscle weakness (generalized): Secondary | ICD-10-CM

## 2021-12-28 NOTE — Therapy (Signed)
[place] ?[facility] ?[address], [address] ?[place], [place], [address] ?Phone: [phone]   Fax:  [phone] ? ?Physical Therapy Treatment ? ?Patient Details  ?Name: Valerie Villa ?MRN: [id number] ?Date of Birth: 01-01-1924 ?Referring Provider (PT): [place] ? ? ?Encounter Date: 12/28/2021 ? ? PT End of Session - 12/28/21 1150   ? ? Visit Number 6   ? Number of Visits 17   ? Date for PT Re-Evaluation 01/29/22   ? Authorization Type HTA   ? Progress Note Due on Visit 10   ? PT Start Time 1105   ? PT Stop Time 1145   ? PT Time Calculation (min) 40 min   ? Activity Tolerance Patient tolerated treatment well   ? Behavior During Therapy [facility] for tasks assessed/performed   ? ?  ?  ? ?  ? ? ?Past Medical History:  ?Diagnosis Date  ? Colon polyps   ? Hearing impairment   ? Hiatal hernia   ? Hx of basal cell carcinoma   ? Hypercholesteremia   ? Hypertension   ? Mild aortic stenosis   ? Osteoporosis   ? Seasonal allergies   ? ? ?History reviewed. No pertinent surgical history. ? ?There were no vitals filed for this visit. ? ? Subjective Assessment - 12/28/21 1108   ? ? Subjective Enjoyed being with my daughter this weekend.  Caregiver reports pt had a little pain, and took Tylenol, but is better now.   ? Patient is accompained by: --   caregiver, [place]  ? Patient Stated Goals Pt's goals for therapy are to improve posture, balance.   ? Currently in Pain? No/denies   ? ?  ?  ? ?  ? ? ? ? ? ? ? ? ? ? ? ? ? ? ? ? ? ? ? ? [place] Adult PT Treatment/Exercise - 12/28/21 0001   ? ?  ? Ambulation/Gait  ? Ambulation/Gait Yes   ? Ambulation Distance (Feet) 85 Feet   ? Assistive device None   ? Gait Pattern Step-through pattern;Lateral trunk lean to left;Narrow base of support   ? Ambulation Surface Level;Indoor   ? Gait Comments Cues with gait to look ahead at targets to improve posture wtih short distances of gait.   ?  ? Exercises  ? Exercises Knee/Hip;Other Exercises   ? Other Exercises  Posture exercises  using Boomwhackers for cross body and open wide 2 x 5 reps; then trunk rotation 2 x 5 reps   ?  ? Knee/Hip Exercises: Seated  ? Long Arc Quad AROM;Strengthening;Right;Left;1 set;2 sets;10 reps   ? Heel Slides Strengthening;Right;Left;2 sets;10 reps   1# ankle weights  ? Other Seated Knee/Hip Exercises Seated adduction ball squeeze x 10 reps   ? Other Seated Knee/Hip Exercises Seated heel toe raises 2 x 10 reps   ? Marching AROM;Strengthening;Right;Left;2 sets;10 reps   ? Abduction/Adduction  Strengthening;Right;Left;1 set;Weights;10 reps   ? Abd/Adduction Weights 1 lbs.   ? ?  ?  ? ?  ? ? ?Reviewed HEP additions last visit:  Pt return demo understanding with initial picture cues from HEP pictures. ? ?- Seated Active Hip Flexion  - 1 x daily - 5 x weekly - 2 sets - 5 reps ?- Lateral Weight Shift with Arm Raise  - 1 x daily - 5 x weekly - 1-2 sets - 10 reps ? ? ? ? ? ? ? ? ? PT Education - 12/28/21 1148   ? ? Education  Details Cues to look ahead for improved posture with gait; discussed possibility of RW and pt/caregiver don't feel she needs it at this point, as she always has supervision/guarding for gait   ? Person(s) Educated Associate Professor)   ? Methods Explanation;Demonstration   ? Comprehension Verbalized understanding   ? ?  ?  ? ?  ? ? ? PT Short Term Goals - 12/04/21 1423   ? ?  ? PT SHORT TERM GOAL #1  ? Title Pt will perform HEP with family supervision for improved strength, balance, posture, and gait.  TARGET 01/01/2022   ? Time 4   ? Period Weeks   ? Status New   ?  ? PT SHORT TERM GOAL #2  ? Title Pt will improve 5x sit<>stand to less than or equal to 15 sec to demonstrate improved functional strength and transfer efficiency.   ? Baseline 20.16 sec   ? Time 4   ? Period Weeks   ? Status New   ?  ? PT SHORT TERM GOAL #3  ? Title Pt/caregiver will report at least 50% improvement in posture to improve participation in sitting and standing ADLs.   ? Baseline increased left lateral lean, with unsupported  sitting and standing   ? Time 4   ? Period Weeks   ? Status New   ? ?  ?  ? ?  ? ? ? ? PT Long Term Goals - 12/04/21 1426   ? ?  ? PT LONG TERM GOAL #1  ? Title Pt will perform HEP with family supervision for improved strength, balance, posture, and gait.  TARGET 01/29/2022   ? Time 8   ? Period Weeks   ? Status New   ?  ? PT LONG TERM GOAL #2  ? Title Pt will improve Berg score to at least 48/56 to decrease fall risk.   ? Baseline 43/56   ? Time 8   ? Period Weeks   ? Status New   ?  ? PT LONG TERM GOAL #3  ? Title Patient will demonsrtated improved TUG </= 15 seconds to reduce fall risk   ? Baseline 22.84 sec   ? Time 8   ? Period Weeks   ? Status New   ?  ? PT LONG TERM GOAL #4  ? Title Pt will improve gait velocity to at least 2.62 ft/sec for improved gait efficiency and safety, using appropriate device.   ? Time 8   ? Period Weeks   ? Status New   ? ?  ?  ? ?  ? ? ? ? ? ? ? ? Plan - 12/28/21 1123   ? ? Clinical Impression Statement Reviewed HEP from last visit, with pt return demo understanding.  Discussed RW that we initially discussed at eval session; pt/caregiver/PT feel right now that she has someone with her to provide supervision.  Pt overall seems to be taking longer strides with gait; she responds well to cues for use of visual target for improved postrue with gait.  She does continue to have increased lateral L lean and tightness in L neck, but she does appear to have improved posture for short bouts. She will continue to benefit from skilled PT towards improved functional mobility and decreased fall risk.   ? Personal Factors and Comorbidities Comorbidity 3+;Time since onset of injury/illness/exacerbation   5 week onset of balance difficulty, postural changes with increased left lateral lean.  ? Comorbidities See problem list   ?  Examination-Activity Limitations Transfers;Locomotion Level;Stand   ? Examination-Participation Restrictions Community Activity;Shop   ? Stability/Clinical Decision Making  Evolving/Moderate complexity   ? Rehab Potential Good   ? PT Frequency 2x / week   ? PT Duration 8 weeks   plus eval  ? PT Treatment/Interventions ADLs/Self Care Home Management;DME Instruction;Neuromuscular re-education;Balance training;Therapeutic exercise;Therapeutic activities;Functional mobility training;Gait training;Stair training;Patient/family education;Passive range of motion;Manual techniques   ? PT Next Visit Plan Check STGs.  Cotninue to work on functional strength, balance, posture activities   ? PT Home Exercise Plan suitcase carry/march, sidebending stretch (seated and standing), chin retractions   ? Consulted and Agree with Plan of Care Patient;Family member/caregiver   ? Family Member Weldon Picking, caregiver   ? ?  ?  ? ?  ? ? ?Patient will benefit from skilled therapeutic intervention in order to improve the following deficits and impairments:  Abnormal gait, Difficulty walking, Decreased balance, Decreased mobility, Decreased strength, Postural dysfunction ? ?Visit Diagnosis: ?Abnormal posture ? ?Muscle weakness (generalized) ? ?Other abnormalities of gait and mobility ? ? ? ? ?Problem List ?Patient Active Problem List  ? Diagnosis Date Noted  ? Nonrheumatic aortic valve stenosis 05/30/2017  ? COPD, mild ([place]) 06/05/2014  ? ? ?Natalina Wieting W., PT ?12/28/2021, 11:54 AM ? ?[place] ?[facility] ?[address] ?[place], [place], [address] ?Phone: [phone]   Fax:  [phone] ? ?Name: Valerie Villa ?MRN: [id number] ?Date of Birth: 06-18-1924 ? ? ? ?

## 2022-01-01 ENCOUNTER — Ambulatory Visit: Payer: PPO | Admitting: Physical Therapy

## 2022-01-15 ENCOUNTER — Ambulatory Visit: Payer: PPO | Attending: Geriatric Medicine | Admitting: Rehabilitative and Restorative Service Providers"

## 2022-01-15 DIAGNOSIS — R293 Abnormal posture: Secondary | ICD-10-CM | POA: Insufficient documentation

## 2022-01-15 DIAGNOSIS — M6281 Muscle weakness (generalized): Secondary | ICD-10-CM | POA: Diagnosis not present

## 2022-01-15 DIAGNOSIS — R2689 Other abnormalities of gait and mobility: Secondary | ICD-10-CM | POA: Diagnosis not present

## 2022-01-15 DIAGNOSIS — R2681 Unsteadiness on feet: Secondary | ICD-10-CM | POA: Diagnosis not present

## 2022-01-15 NOTE — Therapy (Signed)
[place] ?[facility] ?[address], [address] ?[place], [place], [address] ?Phone: [phone]   Fax:  [phone] ? ?Physical Therapy Treatment ? ?Patient Details  ?Name: Valerie Villa ?MRN: [id number] ?Date of Birth: 28-Oct-1923 ?Referring Provider (PT): [place] ? ? ?Encounter Date: 01/15/2022 ? ? PT End of Session - 01/15/22 1018   ? ? Visit Number 7   ? Number of Visits 17   ? Date for PT Re-Evaluation 01/29/22   ? Authorization Type HTA   ? Progress Note Due on Visit 10   ? PT Start Time 1020   ? PT Stop Time 1100   ? PT Time Calculation (min) 40 min   ? Activity Tolerance Patient tolerated treatment well   ? Behavior During Therapy [facility] for tasks assessed/performed   ? ?  ?  ? ?  ? ? ?Past Medical History:  ?Diagnosis Date  ? Colon polyps   ? Hearing impairment   ? Hiatal hernia   ? Hx of basal cell carcinoma   ? Hypercholesteremia   ? Hypertension   ? Mild aortic stenosis   ? Osteoporosis   ? Seasonal allergies   ? ? ?No past surgical history on file. ? ?There were no vitals filed for this visit. ? ? Subjective Assessment - 01/15/22 1023   ? ? Subjective The patient has not been into therapy in a couple of weeks.  She is doing [place] aerobics class 1x/week with caregiver. The patient got neck soreness and pain with AROM ther ex for neck. Stopped doing exercises for the neck.   ? Patient Stated Goals Pt's goals for therapy are to improve posture, balance.   ? Currently in Pain? No/denies   ? ?  ?  ? ?  ? ? ? ? ? ? ? ? ? ? ? ? ? ? ? ? ? ? ? ? [place] Adult PT Treatment/Exercise - 01/15/22 1039   ? ?  ? Ambulation/Gait  ? Ambulation/Gait Yes   ? Ambulation Distance (Feet) 100 Feet   ? Assistive device None   ? Gait Pattern Step-through pattern;Lateral trunk lean to left;Narrow base of support   ? Ambulation Surface Level;Indoor   ? Gait Comments Gait with tactle cues for arm swing, verbal cues for postural upright using visual fixation to cue upright posture.  Backwards walking x 20 feet  x 2 times.   ?  ? Therapeutic Activites   ? Therapeutic Activities Other Therapeutic Activities   ? Other Therapeutic Activities Caregiver mentioned family member wanting to ensure bed positioning was comfortable for the patient.  PT had patient demo sit<>supine> R roll>supine>L roll and return to sitting without difficulty.   ?  ? Neuro Re-ed   ? Neuro Re-ed Details  Sidestepping with arms in T position to engage postural muscles for improved awareness for midline.  Sit<>stand with overhead reach with a ball to lengthen postural muscles x 10 reps.   ?  ? Exercises  ? Exercises Knee/Hip;Other Exercises   ? Other Exercises  Standing posture exercises working on scapular squeeze with horizontal abduction x 10 reps, boomwackers reaching overhead x 10 reps and then horizontaly abducted, then spinal standing twist with boomwackers for larger motion and weight shifting.   ?  ? Lumbar Exercises: Standing  ? Other Standing Lumbar Exercises standing mini squat x 10 reps   ? Other Standing Lumbar Exercises standing dorsiflexion x 10 reps, sit<>stand x 5 reps x 2 sets without UE use.   ?  ?  Lumbar Exercises: Supine  ? Bridge 10 reps   ?  ? Knee/Hip Exercises: Standing  ? Hip Flexion Stengthening;Right;Left;10 reps   ? Hip Flexion Limitations marching near support surface and then without support surface   ? ?  ?  ? ?  ? ? ? ? ? ? ? ? ? ? ? ? PT Short Term Goals - 01/15/22 1024   ? ?  ? PT SHORT TERM GOAL #1  ? Title Pt will perform HEP with family supervision for improved strength, balance, posture, and gait.  TARGET 01/01/2022   ? Baseline Participating in [place] class 1x/week.   ? Time 4   ? Period Weeks   ? Status Partially Met   ?  ? PT SHORT TERM GOAL #2  ? Title Pt will improve 5x sit<>stand to less than or equal to 15 sec to demonstrate improved functional strength and transfer efficiency.   ? Baseline 20.16 sec * continues as 20 seconds on 01/15/22   ? Time 4   ? Period Weeks   ? Status Not Met   ?  ? PT  SHORT TERM GOAL #3  ? Title Pt/caregiver will report at least 50% improvement in posture to improve participation in sitting and standing ADLs.   ? Baseline increased left lateral lean, with unsupported sitting and standing at eval/ caregiver reports she is more aware and responds to cues for posture   ? Time 4   ? Period Weeks   ? Status Partially Met   ? ?  ?  ? ?  ? ? ? ? PT Long Term Goals - 12/04/21 1426   ? ?  ? PT LONG TERM GOAL #1  ? Title Pt will perform HEP with family supervision for improved strength, balance, posture, and gait.  TARGET 01/29/2022   ? Time 8   ? Period Weeks   ? Status New   ?  ? PT LONG TERM GOAL #2  ? Title Pt will improve Berg score to at least 48/56 to decrease fall risk.   ? Baseline 43/56   ? Time 8   ? Period Weeks   ? Status New   ?  ? PT LONG TERM GOAL #3  ? Title Patient will demonsrtated improved TUG </= 15 seconds to reduce fall risk   ? Baseline 22.84 sec   ? Time 8   ? Period Weeks   ? Status New   ?  ? PT LONG TERM GOAL #4  ? Title Pt will improve gait velocity to at least 2.62 ft/sec for improved gait efficiency and safety, using appropriate device.   ? Time 8   ? Period Weeks   ? Status New   ? ?  ?  ? ?  ? ? ? ? ? ? ? ? Plan - 01/15/22 1328   ? ? Clinical Impression Statement The patient has partially met STGs.  She continues to require increased time for 5 time sit to stand.  She is walking for exercise with more independence (caregiver no longer holding her arm on the L side and notes she has more postural awareness), has returned to the gym for 1 class per week.  PT to continue working towards [place].  Patient was not able to get scheduled over the last 1-2 weeks due to dec'd availability at times that she was available, therefore anticipate we may continue beyond 4/28 to finish out her plan of care.   ? Personal Factors and  Comorbidities Comorbidity 3+;Time since onset of injury/illness/exacerbation   5 week onset of balance difficulty, postural changes with increased  left lateral lean.  ? Comorbidities See problem list   ? Examination-Activity Limitations Transfers;Locomotion Level;Stand   ? Examination-Participation Restrictions Community Activity;Shop   ? Stability/Clinical Decision Making Evolving/Moderate complexity   ? Rehab Potential Good   ? PT Frequency 2x / week   ? PT Duration 8 weeks   plus eval  ? PT Treatment/Interventions ADLs/Self Care Home Management;DME Instruction;Neuromuscular re-education;Balance training;Therapeutic exercise;Therapeutic activities;Functional mobility training;Gait training;Stair training;Patient/family education;Passive range of motion;Manual techniques   ? PT Next Visit Plan Work towards [place].  Cotninue to work on functional strength, balance, posture activities   ? PT Home Exercise Plan suitcase carry/march, sidebending stretch (seated and standing), chin retractions   ? Consulted and Agree with Plan of Care Patient;Family member/caregiver   ? Family Member Weldon Picking, caregiver   ? ?  ?  ? ?  ? ? ?Patient will benefit from skilled therapeutic intervention in order to improve the following deficits and impairments:  Abnormal gait, Difficulty walking, Decreased balance, Decreased mobility, Decreased strength, Postural dysfunction ? ?Visit Diagnosis: ?Abnormal posture ? ?Muscle weakness (generalized) ? ?Other abnormalities of gait and mobility ? ? ? ? ?Problem List ?Patient Active Problem List  ? Diagnosis Date Noted  ? Nonrheumatic aortic valve stenosis 05/30/2017  ? COPD, mild ([place]) 06/05/2014  ? ? ?[place], [place] ?01/15/2022, 1:36 PM ? ?[place] ?[facility] ?[address], [address] ?[place], [place], [address] ?Phone: [phone]   Fax:  [phone] ? ?Name: Valerie [place] ?MRN: [id number] ?Date of Birth: 04/30/1924 ? ? ? ?

## 2022-01-19 ENCOUNTER — Ambulatory Visit: Payer: PPO | Admitting: Physical Therapy

## 2022-01-19 ENCOUNTER — Encounter: Payer: Self-pay | Admitting: Physical Therapy

## 2022-01-19 DIAGNOSIS — R293 Abnormal posture: Secondary | ICD-10-CM

## 2022-01-19 DIAGNOSIS — R2689 Other abnormalities of gait and mobility: Secondary | ICD-10-CM

## 2022-01-19 DIAGNOSIS — M6281 Muscle weakness (generalized): Secondary | ICD-10-CM

## 2022-01-19 NOTE — Therapy (Signed)
[place] ?[facility] ?[address], [address] ?[place], [place], [address] ?Phone: [phone]   Fax:  [phone] ? ?Physical Therapy Treatment ? ?Patient Details  ?Name: Valerie Villa ?MRN: [id number] ?Date of Birth: November 05, 1923 ?Referring Provider (PT): [place] ? ? ?Encounter Date: 01/19/2022 ? ? PT End of Session - 01/19/22 1049   ? ? Visit Number 8   ? Number of Visits 17   ? Date for PT Re-Evaluation 01/29/22   ? Authorization Type HTA   ? Progress Note Due on Visit 10   ? PT Start Time 1028   arrives late due to traffic Engineer, materials)  ? PT Stop Time 1104   ? PT Time Calculation (min) 36 min   ? Activity Tolerance Patient tolerated treatment well   ? Behavior During Therapy [facility] for tasks assessed/performed   ? ?  ?  ? ?  ? ? ?Past Medical History:  ?Diagnosis Date  ? Colon polyps   ? Hearing impairment   ? Hiatal hernia   ? Hx of basal cell carcinoma   ? Hypercholesteremia   ? Hypertension   ? Mild aortic stenosis   ? Osteoporosis   ? Seasonal allergies   ? ? ?History reviewed. No pertinent surgical history. ? ?There were no vitals filed for this visit. ? ? Subjective Assessment - 01/19/22 1030   ? ? Subjective No falls, no pain; caregiver reports it's been a bad morning.   ? Patient Stated Goals Pt's goals for therapy are to improve posture, balance.   ? Currently in Pain? No/denies   ? ?  ?  ? ?  ? ? ? ? ? ? ? ? ? ? ? ? ? ? ? ? ? ? ? ? [place] Adult PT Treatment/Exercise - 01/19/22 0001   ? ?  ? Transfers  ? Transfers Sit to Stand;Stand to Sit   ? Sit to Stand 6: Modified independent (Device/Increase time);Without upper extremity assist;From bed   ? Stand to Sit 6: Modified independent (Device/Increase time);Without upper extremity assist;To bed   ? Number of Reps 10 reps;1 set   ? Comments --   ?  ? Ambulation/Gait  ? Ambulation/Gait Yes   ? Ambulation/Gait Assistance 5: Supervision   ? Ambulation Distance (Feet) 60 Feet   x 6 reps, including turns  ? Assistive device None   ? Gait  Pattern Step-through pattern;Lateral trunk lean to left;Narrow base of support   ? Ambulation Surface Level;Indoor   ? Gait Comments Verbal cues for arm swing and to look ahead with gait.   ?  ? Neuro Re-ed   ? Neuro Re-ed Details  Sidestepping with arms in T position to engage postural muscles for improved awareness for midline, 20 ft x 3 reps each direction.  Sit<>stand with overhead reach with a ball to lengthen postural muscles x 10 reps.   ?  ? Exercises  ? Exercises Other Exercises   ? Other Exercises  Standing posture exercises working on scapular squeeze x 10 reps, boomwackers reaching overhead x 10, then horizontal abuction/scapular squeeze x 10; standing at counter with boomwhackers reaching same side to cabinet x 10 reps and then cross body reach x 10 reps, for larger motion and weight shifting.   ? ?  ?  ? ?  ? ? ? ? ? ? ? ? ? ? ? ? PT Short Term Goals - 01/15/22 1024   ? ?  ? PT SHORT TERM GOAL #1  ?  Title Pt will perform HEP with family supervision for improved strength, balance, posture, and gait.  TARGET 01/01/2022   ? Baseline Participating in [place] class 1x/week.   ? Time 4   ? Period Weeks   ? Status Partially Met   ?  ? PT SHORT TERM GOAL #2  ? Title Pt will improve 5x sit<>stand to less than or equal to 15 sec to demonstrate improved functional strength and transfer efficiency.   ? Baseline 20.16 sec * continues as 20 seconds on 01/15/22   ? Time 4   ? Period Weeks   ? Status Not Met   ?  ? PT SHORT TERM GOAL #3  ? Title Pt/caregiver will report at least 50% improvement in posture to improve participation in sitting and standing ADLs.   ? Baseline increased left lateral lean, with unsupported sitting and standing at eval/ caregiver reports she is more aware and responds to cues for posture   ? Time 4   ? Period Weeks   ? Status Partially Met   ? ?  ?  ? ?  ? ? ? ? PT Long Term Goals - 12/04/21 1426   ? ?  ? PT LONG TERM GOAL #1  ? Title Pt will perform HEP with family supervision for  improved strength, balance, posture, and gait.  TARGET 01/29/2022   ? Time 8   ? Period Weeks   ? Status New   ?  ? PT LONG TERM GOAL #2  ? Title Pt will improve Berg score to at least 48/56 to decrease fall risk.   ? Baseline 43/56   ? Time 8   ? Period Weeks   ? Status New   ?  ? PT LONG TERM GOAL #3  ? Title Patient will demonsrtated improved TUG </= 15 seconds to reduce fall risk   ? Baseline 22.84 sec   ? Time 8   ? Period Weeks   ? Status New   ?  ? PT LONG TERM GOAL #4  ? Title Pt will improve gait velocity to at least 2.62 ft/sec for improved gait efficiency and safety, using appropriate device.   ? Time 8   ? Period Weeks   ? Status New   ? ?  ?  ? ?  ? ? ? ? ? ? ? ? Plan - 01/19/22 1202   ? ? Clinical Impression Statement Pt tolerates well more whole body, coordinated Upper and lower body movement patterns, with improved awareness of posture and arm swing during gait, with iminimal cues.  She has most unsteadiness and uncertainty with backwards walking and does appear unsteady at times with turns.  She will continue to benefit from skilled PT towards LTGs for improved overall funcitonal mobility and decreased fall risk.   ? Personal Factors and Comorbidities Comorbidity 3+;Time since onset of injury/illness/exacerbation   5 week onset of balance difficulty, postural changes with increased left lateral lean.  ? Comorbidities See problem list   ? Examination-Activity Limitations Transfers;Locomotion Level;Stand   ? Examination-Participation Restrictions Community Activity;Shop   ? Stability/Clinical Decision Making Evolving/Moderate complexity   ? Rehab Potential Good   ? PT Frequency 2x / week   ? PT Duration 8 weeks   plus eval  ? PT Treatment/Interventions ADLs/Self Care Home Management;DME Instruction;Neuromuscular re-education;Balance training;Therapeutic exercise;Therapeutic activities;Functional mobility training;Gait training;Stair training;Patient/family education;Passive range of motion;Manual  techniques   ? PT Next Visit Plan Work towards [place].  Need to ask about additional  appts/additional POC;  Work on sidestepping, forward/back walking (perhaps add to HEP); starts/stops with gait; functional strength, balance, posture.   ? Consulted and Agree with Plan of Care Patient;Family member/caregiver   ? Family Member Weldon Picking, caregiver   ? ?  ?  ? ?  ? ? ?Patient will benefit from skilled therapeutic intervention in order to improve the following deficits and impairments:  Abnormal gait, Difficulty walking, Decreased balance, Decreased mobility, Decreased strength, Postural dysfunction ? ?Visit Diagnosis: ?Muscle weakness (generalized) ? ?Abnormal posture ? ?Other abnormalities of gait and mobility ? ? ? ? ?Problem List ?Patient Active Problem List  ? Diagnosis Date Noted  ? Nonrheumatic aortic valve stenosis 05/30/2017  ? COPD, mild ([place]) 06/05/2014  ? ? ?Darald Uzzle W., PT ?01/19/2022, 12:05 PM ? ?[place] ?[facility] ?[address] ?[place], [place], [address] ?Phone: [phone]   Fax:  [phone] ? ?Name: Valerie Villa ?MRN: [id number] ?Date of Birth: 08-03-1924 ? ? ? ?

## 2022-01-20 DIAGNOSIS — R300 Dysuria: Secondary | ICD-10-CM | POA: Diagnosis not present

## 2022-01-21 ENCOUNTER — Ambulatory Visit: Payer: PPO | Admitting: Physical Therapy

## 2022-01-21 ENCOUNTER — Encounter: Payer: Self-pay | Admitting: Physical Therapy

## 2022-01-21 DIAGNOSIS — R293 Abnormal posture: Secondary | ICD-10-CM | POA: Diagnosis not present

## 2022-01-21 DIAGNOSIS — M6281 Muscle weakness (generalized): Secondary | ICD-10-CM

## 2022-01-21 DIAGNOSIS — R2689 Other abnormalities of gait and mobility: Secondary | ICD-10-CM

## 2022-01-21 NOTE — Therapy (Signed)
[place] ?[facility] ?[address] ?[place], [place], [address] ?Phone: [phone]   Fax:  [phone] ? ?Physical Therapy Treatment ? ?Patient Details  ?Name: Valerie Villa Villa ?MRN: [id number] ?Date of Birth: 11-17-1923 ?Referring Provider (PT): [place] ? ? ?Encounter Date: 01/21/2022 ? ? PT End of Session - 01/21/22 1242   ? ? Visit Number 9   ? Number of Visits 17   ? Date for PT Re-Evaluation 01/29/22   ? Authorization Type HTA   ? Progress Note Due on Visit 10   ? PT Start Time 1016   ? PT Stop Time 1059   ? PT Time Calculation (min) 43 min   ? Activity Tolerance Patient tolerated treatment well   ? Behavior During Therapy [facility] for tasks assessed/performed   ? ?  ?  ? ?  ? ? ?Past Medical History:  ?Diagnosis Date  ? Colon polyps   ? Hearing impairment   ? Hiatal hernia   ? Hx of basal cell carcinoma   ? Hypercholesteremia   ? Hypertension   ? Mild aortic stenosis   ? Osteoporosis   ? Seasonal allergies   ? ? ?History reviewed. No pertinent surgical history. ? ?There were no vitals filed for this visit. ? ? Subjective Assessment - 01/21/22 1023   ? ? Subjective Caregiver reports pt is now on antibiotic for UTI; she has been working on walking with arm swing.   ? Patient Stated Goals Pt's goals for therapy are to improve posture, balance.   ? Currently in Pain? No/denies   ? ?  ?  ? ?  ? ? ? ? ? ? ? ? ? ? ? ? ? ? ? ? ? ? ? ? [place] Adult PT Treatment/Exercise - 01/21/22 0001   ? ?  ? Transfers  ? Transfers Sit to Stand;Stand to Sit   ? Sit to Stand 6: Modified independent (Device/Increase time);Without upper extremity assist;From bed   ? Stand to Sit 6: Modified independent (Device/Increase time);Without upper extremity assist;To bed   ? Number of Reps 2 sets;10 reps   ? Comments holding ball and reaching overhead upon standing   ?  ? Ambulation/Gait  ? Ambulation/Gait Yes   ? Ambulation/Gait Assistance 5: Supervision   ? Ambulation Distance (Feet) 60 Feet   x 2  ? Assistive  device None   ? Gait Pattern Step-through pattern;Lateral trunk lean to left;Narrow base of support   ? Ambulation Surface Level;Indoor   ? Gait Comments Verbal cues for arm swing and to look ahead with gait.  Pt and caregiver report doing this more at home.   ?  ? Neuro Re-ed   ? Neuro Re-ed Details  Sidestepping with arms in T position to engage postural muscles for improved awareness for midline, 20 ft x 1 rep each direction.  Forward/back walking 20 ft x 3 reps   ?  ? Exercises  ? Exercises Other Exercises   ? Other Exercises  Standing posture exercises working on scapular squeeze x 10 reps, boomwackers reaching overhead x 10, then horizontal abuction/scapular squeeze x 10; standing at counter with boomwhackers reaching same side to cabinet x 10 reps and then cross body reach x 10 reps, for larger motion and weight shifting.   ? ?  ?  ? ?  ? ? ? ? ? ? Balance Exercises - 01/21/22 0001   ? ?  ? Balance Exercises: Standing  ? SLS with Vectors Solid  surface;Limitations   ? SLS with Vectors Limitations alternating forward step taps to 6" step x 10, side step taps x 10 reps each leg; min guard, no UE support.   ? Step Ups Forward;UE support 2;6 inch;Limitations   ? Step Ups Limitations 10 reps each leg   ? ?  ?  ? ?  ? ? ? ? ? ? ? PT Short Term Goals - 01/15/22 1024   ? ?  ? PT SHORT TERM GOAL #1  ? Title [name] Villa perform HEP with family supervision for improved strength, balance, posture, and gait.  TARGET 01/01/2022   ? Baseline Participating in [place] class 1x/week.   ? Time 4   ? Period Weeks   ? Status Partially Met   ?  ? PT SHORT TERM GOAL #2  ? Title [name] Villa improve 5x sit<>stand to less than or equal to 15 sec to demonstrate improved functional strength and transfer efficiency.   ? Baseline 20.16 sec * continues as 20 seconds on 01/15/22   ? Time 4   ? Period Weeks   ? Status Not Met   ?  ? PT SHORT TERM GOAL #3  ? Title Pt/caregiver Valerie Villa report at least 50% improvement in posture to improve  participation in sitting and standing ADLs.   ? Baseline increased left lateral lean, with unsupported sitting and standing at eval/ caregiver reports she is more aware and responds to cues for posture   ? Time 4   ? Period Weeks   ? Status Partially Met   ? ?  ?  ? ?  ? ? ? ? PT Long Term Goals - 12/04/21 1426   ? ?  ? PT LONG TERM GOAL #1  ? Title [name] Villa perform HEP with family supervision for improved strength, balance, posture, and gait.  TARGET 01/29/2022   ? Time 8   ? Period Weeks   ? Status New   ?  ? PT LONG TERM GOAL #2  ? Title [name] Villa improve Berg score to at least 48/56 to decrease fall risk.   ? Baseline 43/56   ? Time 8   ? Period Weeks   ? Status New   ?  ? PT LONG TERM GOAL #3  ? Title [name] Villa demonsrtated improved TUG </= 15 seconds to reduce fall risk   ? Baseline 22.84 sec   ? Time 8   ? Period Weeks   ? Status New   ?  ? PT LONG TERM GOAL #4  ? Title [name] Villa improve gait velocity to at least 2.62 ft/sec for improved gait efficiency and safety, using appropriate device.   ? Time 8   ? Period Weeks   ? Status New   ? ?  ?  ? ?  ? ? ? ? ? ? ? ? Plan - 01/21/22 1024   ? ? Clinical Impression Statement Pt demonstrates good carryover with improved posture and arm swing with gait today.  With step tap exercises, she demo ability to perform without UE support.  She is progressing towards goals for improved balance and gait stability and Valerie Villa continue to benefit from skilled PT towards goals.   ? Personal Factors and Comorbidities Comorbidity 3+;Time since onset of injury/illness/exacerbation   5 week onset of balance difficulty, postural changes with increased left lateral lean.  ? Comorbidities See problem list   ? Examination-Activity Limitations Transfers;Locomotion Level;Stand   ? Examination-Participation Restrictions Community Activity;Shop   ?  Stability/Clinical Decision Making Evolving/Moderate complexity   ? Rehab Potential Good   ? PT Frequency 2x / week   ? PT Duration 8 weeks   plus  eval  ? PT Treatment/Interventions ADLs/Self Care Home Management;DME Instruction;Neuromuscular re-education;Balance training;Therapeutic exercise;Therapeutic activities;Functional mobility training;Gait training;Stair training;Patient/family education;Passive range of motion;Manual techniques   ? PT Next Visit Plan 10th Visit progress note/Recert next week.  Work on sidestepping, forward/back walking (perhaps add to HEP); starts/stops with gait; functional strength, balance, posture.   ? Consulted and Agree with Plan of Care Patient;Family member/caregiver   ? Family Member Weldon Picking, caregiver   ? ?  ?  ? ?  ? ? ?[name] Villa benefit from skilled therapeutic intervention in order to improve the following deficits and impairments:  Abnormal gait, Difficulty walking, Decreased balance, Decreased mobility, Decreased strength, Postural dysfunction ? ?Visit Diagnosis: ?Muscle weakness (generalized) ? ?Abnormal posture ? ?Other abnormalities of gait and mobility ? ? ? ? ?Problem List ?Patient Active Problem List  ? Diagnosis Date Noted  ? Nonrheumatic aortic valve stenosis 05/30/2017  ? COPD, mild ([place]) 06/05/2014  ? ? ?Danialle Dement W., PT ?01/21/2022, 12:45 PM ? ?[place] ?[facility] ?[address] ?[place], [place], [address] ?Phone: [phone]   Fax:  [phone] ? ?Name: Valerie Villa Villa ?MRN: [id number] ?Date of Birth: Mar 16, 1924 ? ? ? ?

## 2022-01-26 ENCOUNTER — Ambulatory Visit: Payer: PPO | Admitting: Physical Therapy

## 2022-01-26 ENCOUNTER — Encounter: Payer: Self-pay | Admitting: Physical Therapy

## 2022-01-26 DIAGNOSIS — R293 Abnormal posture: Secondary | ICD-10-CM

## 2022-01-26 DIAGNOSIS — R2681 Unsteadiness on feet: Secondary | ICD-10-CM

## 2022-01-26 DIAGNOSIS — M6281 Muscle weakness (generalized): Secondary | ICD-10-CM

## 2022-01-26 DIAGNOSIS — R2689 Other abnormalities of gait and mobility: Secondary | ICD-10-CM

## 2022-01-27 NOTE — Therapy (Signed)
[place] ?[facility] ?[address], [address] ?[place], [place], [address] ?Phone: [phone]   Fax:  [phone] ? ?Physical Therapy Treatment/10th Visit Progress Note/REcert ? ?Patient Details  ?Name: Valerie Villa ?MRN: [id number] ?Date of Birth: [birth year] ?Referring Provider (PT): [place] ? ? ?Encounter Date: 01/26/2022 ? ? PT End of Session - 01/26/22 1019   ? ? Visit Number 10   ? Number of Visits 17   ? Date for PT Re-Evaluation 01/29/22   ? Authorization Type HTA   ? Progress Note Due on Visit 10   ? PT Start Time 1019   ? PT Stop Time 1497   ? PT Time Calculation (min) 44 min   ? Activity Tolerance Patient tolerated treatment well   ? Behavior During Therapy [facility] for tasks assessed/performed   ? ?  ?  ? ?  ? ? ?Past Medical History:  ?Diagnosis Date  ? Colon polyps   ? Hearing impairment   ? Hiatal hernia   ? Hx of basal cell carcinoma   ? Hypercholesteremia   ? Hypertension   ? Mild aortic stenosis   ? Osteoporosis   ? Seasonal allergies   ? ? ?History reviewed. No pertinent surgical history. ? ?There were no vitals filed for this visit. ? ? Subjective Assessment - 01/26/22 1019   ? ? Subjective Caregiver reports a spot on her leg that has broken down (nothing new) and it has a bandage on it.  Otherwise, doing good.   ? Patient Stated Goals Pt's goals for therapy are to improve posture, balance.   ? Currently in Pain? No/denies   ? ?  ?  ? ?  ? ? ? ? ? ? ? ? ? ? ? ? ? ? ? ? ? ? ? ? [place] Adult PT Treatment/Exercise - 01/26/22 1020   ? ?  ? Transfers  ? Transfers Sit to Stand;Stand to Sit   ? Sit to Stand 6: Modified independent (Device/Increase time);Without upper extremity assist;From bed   ? Five time sit to stand comments  14.44   ? Stand to Sit 6: Modified independent (Device/Increase time);Without upper extremity assist;To bed   ?  ? Ambulation/Gait  ? Ambulation/Gait Yes   ? Ambulation/Gait Assistance 5: Supervision   ? Ambulation Distance (Feet) 480 Feet   ( 60 ft x 8  reps)  ? Assistive device None   ? Gait Pattern Step-through pattern;Lateral trunk lean to left;Narrow base of support   ? Ambulation Surface Level;Indoor   ? Gait velocity 10.10 sec = 3.25 ft/sec   ?  ? Standardized Balance Assessment  ? Standardized Balance Assessment Timed Up and Go Test;Berg Balance Test;Dynamic Gait Index   ?  ? Berg Balance Test  ? Sit to Stand Able to stand without using hands and stabilize independently   ? Standing Unsupported Able to stand safely 2 minutes   ? Sitting with Back Unsupported but Feet Supported on Floor or Stool Able to sit safely and securely 2 minutes   ? Stand to Sit Sits safely with minimal use of hands   ? Transfers Able to transfer safely, minor use of hands   ? Standing Unsupported with Eyes Closed Able to stand 10 seconds safely   ? Standing Ubsupported with Feet Together Able to place feet together independently and stand 1 minute safely   ? From Standing, Reach Forward with Outstretched Arm Reaches forward but needs supervision   ? From Standing Position, Pick  up Object from Floor Able to pick up shoe, needs supervision   ? From Standing Position, Turn to Look Behind Over each Shoulder Looks behind from both sides and weight shifts well   ? Turn 360 Degrees Able to turn 360 degrees safely but slowly   5, 5.8 sec  ? Standing Unsupported, Alternately Place Feet on Step/Stool Able to stand independently and complete 8 steps >20 seconds   ? Standing Unsupported, One Foot in Front Able to plae foot ahead of the other independently and hold 30 seconds   ? Standing on One Leg Tries to lift leg/unable to hold 3 seconds but remains standing independently   ? Total Score 45   ?  ? Dynamic Gait Index  ? Level Surface Mild Impairment   ? Change in Gait Speed Mild Impairment   ? Gait with Horizontal Head Turns Moderate Impairment   ? Gait with Vertical Head Turns Moderate Impairment   ? Gait and Pivot Turn Mild Impairment   3.35  ? Step Over Obstacle Severe Impairment   ? Step  Around Obstacles Mild Impairment   ? Steps Mild Impairment   ? Total Score 12   ?  ? Timed Up and Go Test  ? TUG Normal TUG   ? Normal TUG (seconds) 16.41   16.41, 17.06  ?  ? Self-Care  ? Self-Care Other Self-Care Comments   ? Other Self-Care Comments  Progress towards goals, POC and continued skilled PT needs   ? ?  ?  ? ?  ? ? ? ? ? ? ? ? ? ? PT Education - 01/27/22 0829   ? ? Education Details Progress towards goals, POC   ? Person(s) Educated Associate Professor)   ? Methods Explanation   ? Comprehension Verbalized understanding   ? ?  ?  ? ?  ? ? ? PT Short Term Goals - 01/15/22 1024   ? ?  ? PT SHORT TERM GOAL #1  ? Title Pt will perform HEP with family supervision for improved strength, balance, posture, and gait.  TARGET 01/01/2022   ? Baseline Participating in [place] class 1x/week.   ? Time 4   ? Period Weeks   ? Status Partially Met   ?  ? PT SHORT TERM GOAL #2  ? Title Pt will improve 5x sit<>stand to less than or equal to 15 sec to demonstrate improved functional strength and transfer efficiency.   ? Baseline 20.16 sec * continues as 20 seconds on 01/15/22   ? Time 4   ? Period Weeks   ? Status Not Met   ?  ? PT SHORT TERM GOAL #3  ? Title Pt/caregiver will report at least 50% improvement in posture to improve participation in sitting and standing ADLs.   ? Baseline increased left lateral lean, with unsupported sitting and standing at eval/ caregiver reports she is more aware and responds to cues for posture   ? Time 4   ? Period Weeks   ? Status Partially Met   ? ?  ?  ? ?  ? ? ? ? PT Long Term Goals - 01/26/22 1028   ? ?  ? PT LONG TERM GOAL #1  ? Title Pt will perform HEP with family supervision for improved strength, balance, posture, and gait.  TARGET 01/29/2022   ? Baseline supervision with current HEP, will benefit from   ? Time 8   ? Period Weeks   ? Status On-going   ?  ?  PT LONG TERM GOAL #2  ? Title Pt will improve Berg score to at least 48/56 to decrease fall risk.   ? Baseline  43/56>45/56 01/26/2022   ? Time 8   ? Period Weeks   ? Status Partially Met   ?  ? PT LONG TERM GOAL #3  ? Title Patient will demonsrtated improved TUG </= 15 seconds to reduce fall risk   ? Baseline 22.84 sec; 16.41 sec 01/26/2022   ? Time 8   ? Period Weeks   ? Status Partially Met   ?  ? PT LONG TERM GOAL #4  ? Title Pt will improve gait velocity to at least 2.62 ft/sec for improved gait efficiency and safety, using appropriate device.   ? Baseline 3.25 ft/sec 01/26/2022   ? Time 8   ? Period Weeks   ? Status Achieved   ? ?  ?  ? ?  ? ? ? ?for Recert: ? ? PT Short Term Goals - 01/27/22 0847   ? ?  ? PT SHORT TERM GOAL #1  ? Title Pt will perform progression of HEP with family supervision for improved strength, balance, posture, and gait.  TARGET 02/12/2022   ? Time 3   ? Period Weeks   ? Status Revised   ? ?  ?  ? ?  ? ? PT Long Term Goals - 01/26/22 1028   ? ?  ? PT LONG TERM GOAL #1  ? Title Pt will verbalize plans for continued community fitness upon d/c from PT.   TARGET 03/12/2022   ? Baseline supervision with current HEP, will benefit from additional updates   ? Time 7   ? Period Weeks   ? Status New   ?  ? PT LONG TERM GOAL #2  ? Title Pt will improve Berg score to at least 48/56 to decrease fall risk.   ? Baseline 43/56>45/56 01/26/2022   ? Time 7   ? Period Weeks   ? Status On-going   ?  ? PT LONG TERM GOAL #3  ? Title Patient will demonsrtated improved TUG </= 15 seconds to reduce fall risk   ? Baseline 22.84 sec; 16.41 sec 01/26/2022   ? Time 7   ? Period Weeks   ? Status On-going   ?  ? PT LONG TERM GOAL #4  ? Title Pt will improve Dynamic Gait Index to at least 16/24 for decreased fall risk.   ? Baseline 12/24   ? Time 7   ? Period Weeks   ? Status New   ? ?  ?  ? ?  ? ? PT End of Session - 01/26/22 1019   ? ? Visit Number 10   ? Number of Visits 22   ? Date for PT Re-Evaluation 44/31/54   recert, including week of 01/25/2022  ? Authorization Type HTA   ? Progress Note Due on Visit 10   ? PT Start Time 1019    ? PT Stop Time 0086   ? PT Time Calculation (min) 44 min   ? Activity Tolerance Patient tolerated treatment well   ? Behavior During Therapy [facility] for tasks assessed/performed   ? ?  ?  ? ?  ? ? ? ? ? ? Plan -

## 2022-01-28 ENCOUNTER — Ambulatory Visit: Payer: PPO | Admitting: Physical Therapy

## 2022-01-28 DIAGNOSIS — M6281 Muscle weakness (generalized): Secondary | ICD-10-CM

## 2022-01-28 DIAGNOSIS — R293 Abnormal posture: Secondary | ICD-10-CM | POA: Diagnosis not present

## 2022-01-28 DIAGNOSIS — R2681 Unsteadiness on feet: Secondary | ICD-10-CM

## 2022-01-28 NOTE — Patient Instructions (Signed)
Access Code: [email or web address] ?URL: [email or web address] ?Date: 01/28/2022 ?Prepared by: [facility] ? ?Exercises ?- Seated Active Hip Flexion  - 1 x daily - 5 x weekly - 2 sets - 5 reps ?- Lateral Weight Shift with Arm Raise  - 1 x daily - 5 x weekly - 1-2 sets - 10 reps ?- Sit to Stand with Arm Swing  - 1 x daily - 5 x weekly - 2 sets - 5 reps ?- Sidestepping  - 1 x daily - 7 x weekly - 3 sets - 10 reps ?

## 2022-01-28 NOTE — Therapy (Signed)
[place] ?[facility] ?[address], [address] ?[place], [place], [address] ?Phone: [phone]   Fax:  [phone] ? ?Physical Therapy Treatment ? ?Patient Details  ?Name: Valerie Villa ?MRN: [id number] ?Date of Birth: 1923/12/22 ?Referring Provider (PT): McMullen ? ? ?Encounter Date: 01/28/2022 ? ? PT End of Session - 01/28/22 1026   ? ? Visit Number 11   ? Number of Visits 22   ? Date for PT Re-Evaluation 50/53/97   recert, including week of 01/25/2022  ? Authorization Type HTA   ? Progress Note Due on Visit 20   ? PT Start Time 1020   ? PT Stop Time 1102   ? PT Time Calculation (min) 42 min   ? Activity Tolerance Patient tolerated treatment well   ? Behavior During Therapy [facility] for tasks assessed/performed   ? ?  ?  ? ?  ? ? ?Past Medical History:  ?Diagnosis Date  ? Colon polyps   ? Hearing impairment   ? Hiatal hernia   ? Hx of basal cell carcinoma   ? Hypercholesteremia   ? Hypertension   ? Mild aortic stenosis   ? Osteoporosis   ? Seasonal allergies   ? ? ?No past surgical history on file. ? ?There were no vitals filed for this visit. ? ? Subjective Assessment - 01/28/22 1028   ? ? Subjective Doing well.   ? Patient Stated Goals Pt's goals for therapy are to improve posture, balance.   ? Currently in Pain? No/denies   ? ?  ?  ? ?  ? ? ? ? ? ? ? ? ? ? ? ? ? ? ? ? ? ? ? ? [place] Adult PT Treatment/Exercise - 01/28/22 0001   ? ?  ? Transfers  ? Transfers Sit to Stand;Stand to Sit   ? Sit to Stand 6: Modified independent (Device/Increase time);Without upper extremity assist;From bed   ? Stand to Sit 6: Modified independent (Device/Increase time);Without upper extremity assist;To bed   ? Number of Reps 2 sets;Other reps (comment)   5 reps  ? Comments holding ball, reaching overhead upon standing   ?  ? Ambulation/Gait  ? Ambulation/Gait Yes   ? Ambulation/Gait Assistance 5: Supervision   ? Ambulation Distance (Feet) 360 Feet   ? Assistive device None   ? Gait Pattern Step-through  pattern;Lateral trunk lean to left;Narrow base of support   ? Ambulation Surface Level;Indoor   ?  ? Neuro Re-ed   ? Neuro Re-ed Details  Sidestepping with arms in T position to engage postural muscles for improved awareness for midline, 20 ft x 2 reps each direction.  Forward/back walking 20 ft x 4 reps, UE support at counter with backward walking   ?  ? Exercises  ? Exercises Other Exercises   ? Other Exercises  Standing posture exercises working on weightshifting laterally and reaching, looking up to hand for active neck motion, 10 reps at counter. Seated posture exercises, hands on knees to open wide posture x 10, forward flexed to upright sit x 10.  Cross body reach with ball x 10 reps.   ?  ? Knee/Hip Exercises: Aerobic  ? Nustep Level 1, 4 extremities x 4 minutes for lower extremity strenghthening, 100 steps total   ? ?  ?  ? ?  ? ? ? ? ? ? ? ? ? ? PT Education - 01/28/22 1210   ? ? Education Details Updates to HEP-see instructions   ? Person(s)  Educated Patient;Caregiver(s)   ? Methods Explanation;Demonstration;Handout;Verbal cues   ? Comprehension Verbalized understanding;Returned demonstration;Verbal cues required   ? ?  ?  ? ?  ? ? ? PT Short Term Goals - 01/27/22 0847   ? ?  ? PT SHORT TERM GOAL #1  ? Title Pt will perform progression of HEP with family supervision for improved strength, balance, posture, and gait.  TARGET 02/12/2022   ? Time 3   ? Period Weeks   ? Status Revised   ? ?  ?  ? ?  ? ? ? ? PT Long Term Goals - 01/26/22 1028   ? ?  ? PT LONG TERM GOAL #1  ? Title Pt will verbalize plans for continued community fitness upon d/c from PT.   TARGET 03/12/2022   ? Baseline supervision with current HEP, will benefit from additional updates   ? Time 7   ? Period Weeks   ? Status New   ?  ? PT LONG TERM GOAL #2  ? Title Pt will improve Berg score to at least 48/56 to decrease fall risk.   ? Baseline 43/56>45/56 01/26/2022   ? Time 7   ? Period Weeks   ? Status On-going   ?  ? PT LONG TERM GOAL #3  ?  Title Patient will demonsrtated improved TUG </= 15 seconds to reduce fall risk   ? Baseline 22.84 sec; 16.41 sec 01/26/2022   ? Time 7   ? Period Weeks   ? Status On-going   ?  ? PT LONG TERM GOAL #4  ? Title Pt will improve Dynamic Gait Index to at least 16/24 for decreased fall risk.   ? Baseline 12/24   ? Time 7   ? Period Weeks   ? Status New   ? ?  ?  ? ?  ? ? ? ? ? ? ? ? Plan - 01/28/22 1211   ? ? Clinical Impression Statement Continued focus on postural strengthening, lower extremity strengthening and balance exercises incorporating upper/lower body and head/neck motions, cues for gentle motion to avoid pain ranges.  Pt performs well and exercises updated in HEP.  Pt will continue to benefit from skilled PT towards goals for improved mobility and decreased fall risk.   ? Personal Factors and Comorbidities Comorbidity 3+;Time since onset of injury/illness/exacerbation   5 week onset of balance difficulty, postural changes with increased left lateral lean.  ? Comorbidities See problem list   ? Examination-Activity Limitations Transfers;Locomotion Level;Stand   ? Examination-Participation Restrictions Community Activity;Shop   ? Stability/Clinical Decision Making Evolving/Moderate complexity   ? Rehab Potential Good   ? PT Frequency 2x / week   ? PT Duration Other (comment)   7 weeks, including week of 01/25/22  ? PT Treatment/Interventions ADLs/Self Care Home Management;DME Instruction;Neuromuscular re-education;Balance training;Therapeutic exercise;Therapeutic activities;Functional mobility training;Gait training;Stair training;Patient/family education;Passive range of motion;Manual techniques   ? PT Next Visit Plan Check additions to HEP; starts/stops with gait; functional strength, balance, posture.  Address posture through functional activities (weightshifting, reaching, etc)   ? Consulted and Agree with Plan of Care Patient;Family member/caregiver   ? Family Member Weldon Picking, caregiver   ? ?  ?  ? ?   ? ? ?Patient will benefit from skilled therapeutic intervention in order to improve the following deficits and impairments:  Abnormal gait, Difficulty walking, Decreased balance, Decreased mobility, Decreased strength, Postural dysfunction ? ?Visit Diagnosis: ?Muscle weakness (generalized) ? ?Abnormal posture ? ?Unsteadiness on feet ? ? ? ? ?  Problem List ?Patient Active Problem List  ? Diagnosis Date Noted  ? Nonrheumatic aortic valve stenosis 05/30/2017  ? COPD, mild ([place]) 06/05/2014  ? ? ?Jaselynn Tamas W., PT ?01/28/2022, 12:13 PM ? ?[place] ?[facility] ?[address], [address] ?[place], [place], [address] ?Phone: [phone]   Fax:  [phone] ? ?Name: FORREST DEMURO ?MRN: [id number] ?Date of Birth: 1923/10/23 ? ? ? ?

## 2022-01-29 DIAGNOSIS — N390 Urinary tract infection, site not specified: Secondary | ICD-10-CM | POA: Diagnosis not present

## 2022-02-02 ENCOUNTER — Ambulatory Visit: Payer: PPO | Attending: Geriatric Medicine | Admitting: Physical Therapy

## 2022-02-02 ENCOUNTER — Encounter: Payer: Self-pay | Admitting: Physical Therapy

## 2022-02-02 DIAGNOSIS — R2681 Unsteadiness on feet: Secondary | ICD-10-CM | POA: Diagnosis not present

## 2022-02-02 DIAGNOSIS — M6281 Muscle weakness (generalized): Secondary | ICD-10-CM | POA: Insufficient documentation

## 2022-02-02 DIAGNOSIS — R2689 Other abnormalities of gait and mobility: Secondary | ICD-10-CM | POA: Insufficient documentation

## 2022-02-02 DIAGNOSIS — R293 Abnormal posture: Secondary | ICD-10-CM | POA: Diagnosis not present

## 2022-02-02 NOTE — Therapy (Signed)
[place] ?[facility] ?[address], [address] ?[place], [place], [address] ?Phone: [phone]   Fax:  [phone] ? ?Physical Therapy Treatment ? ?Patient Details  ?Name: Valerie Villa ?MRN: [id number] ?Date of Birth: [birth year] ?Referring Provider (PT): [place] ? ? ?Encounter Date: 02/02/2022 ? ? PT End of Session - 02/02/22 1122   ? ? Visit Number 12   ? Number of Visits 22   ? Date for PT Re-Evaluation 95/28/41   recert, including week of 01/25/2022  ? Authorization Type HTA   ? Progress Note Due on Visit 20   ? PT Start Time 1020   ? PT Stop Time 1058   ? PT Time Calculation (min) 38 min   ? Activity Tolerance Patient tolerated treatment well   ? Behavior During Therapy [facility] for tasks assessed/performed   ? ?  ?  ? ?  ? ? ?Past Medical History:  ?Diagnosis Date  ? Colon polyps   ? Hearing impairment   ? Hiatal hernia   ? Hx of basal cell carcinoma   ? Hypercholesteremia   ? Hypertension   ? Mild aortic stenosis   ? Osteoporosis   ? Seasonal allergies   ? ? ?History reviewed. No pertinent surgical history. ? ?There were no vitals filed for this visit. ? ? Subjective Assessment - 02/02/22 1021   ? ? Subjective Doing okay today.   ? Patient Stated Goals Pt's goals for therapy are to improve posture, balance.   ? Currently in Pain? No/denies   ? ?  ?  ? ?  ? ? ? ? ? ? ? ? ? ? ? ? ? ? ? ? ? ? ? ? [place] Adult PT Treatment/Exercise - 02/02/22 0001   ? ?  ? Transfers  ? Transfers Sit to Stand;Stand to Sit   ? Sit to Stand 6: Modified independent (Device/Increase time);Without upper extremity assist;From bed   ? Stand to Sit 6: Modified independent (Device/Increase time);Without upper extremity assist;To bed   ? Number of Reps 2 sets;Other reps (comment)   5 reps  ? Comments holding ball, reaching overhead upon standing   ?  ? Ambulation/Gait  ? Ambulation/Gait Yes   ? Ambulation/Gait Assistance 5: Supervision   ? Ambulation Distance (Feet) 170 Feet   255 ft, additional short distances in gym  area  ? Assistive device None   ? Gait Pattern Step-through pattern;Lateral trunk lean to left;Narrow base of support   ? Ambulation Surface Level;Indoor   ? Gait Comments Short distance gait with head turns right and left, at counter, 10 ft x 6 reps; progressed to head turns with gait in gym for environmental scanning.   ?  ? Exercises  ? Exercises Other Exercises   ? Other Exercises  Standing posture exercises working on weightshifting laterally and reaching, looking up to hand for active neck motion, 10 reps at counter. Seated posture exercises, hands on knees to open wide posture x 10, forward flexed to upright sit x 10.  Cross body reach with ball x 10 reps.  Diagonal reach with ball knee >shoulder, x 10.   ? ?  ?  ? ?  ? ? ? ? ? ? Balance Exercises - 02/02/22 0001   ? ?  ? Balance Exercises: Standing  ? SLS with Vectors Solid surface;Limitations   ? SLS with Vectors Limitations alternating forward step taps to 6" step x 10, then to 12" step x 10 reps each leg; min guard,  1 UE support.   ? Step Ups Forward;UE support 2;6 inch;Limitations   ? Step Ups Limitations 10 reps each leg   ? Heel Raises Both;10 reps   2 sets  ? Toe Raise Both;10 reps   2 sets  ? Other Standing Exercises Side step together x 10 reps, 2 sets; BUE support   ? ?  ?  ? ?  ? ? ? ? ? ? ? PT Short Term Goals - 01/27/22 0847   ? ?  ? PT SHORT TERM GOAL #1  ? Title Pt will perform progression of HEP with family supervision for improved strength, balance, posture, and gait.  TARGET 02/12/2022   ? Time 3   ? Period Weeks   ? Status Revised   ? ?  ?  ? ?  ? ? ? ? PT Long Term Goals - 01/26/22 1028   ? ?  ? PT LONG TERM GOAL #1  ? Title Pt will verbalize plans for continued community fitness upon d/c from PT.   TARGET 03/12/2022   ? Baseline supervision with current HEP, will benefit from additional updates   ? Time 7   ? Period Weeks   ? Status New   ?  ? PT LONG TERM GOAL #2  ? Title Pt will improve Berg score to at least 48/56 to decrease fall risk.    ? Baseline 43/56>45/56 01/26/2022   ? Time 7   ? Period Weeks   ? Status On-going   ?  ? PT LONG TERM GOAL #3  ? Title Patient will demonsrtated improved TUG </= 15 seconds to reduce fall risk   ? Baseline 22.84 sec; 16.41 sec 01/26/2022   ? Time 7   ? Period Weeks   ? Status On-going   ?  ? PT LONG TERM GOAL #4  ? Title Pt will improve Dynamic Gait Index to at least 16/24 for decreased fall risk.   ? Baseline 12/24   ? Time 7   ? Period Weeks   ? Status New   ? ?  ?  ? ?  ? ? ? ? ? ? ? ? Plan - 02/02/22 1123   ? ? Clinical Impression Statement Incoroporated today head turns with short distance gait at counter, with head motions for environmental scanning tasks with gait in gym.  Pt experiences slower pace with gait, but overall no LOB noted.  She does not have any c/o pain with neck motion with environmental scanning tasks.   ? Personal Factors and Comorbidities Comorbidity 3+;Time since onset of injury/illness/exacerbation   5 week onset of balance difficulty, postural changes with increased left lateral lean.  ? Comorbidities See problem list   ? Examination-Activity Limitations Transfers;Locomotion Level;Stand   ? Examination-Participation Restrictions Community Activity;Shop   ? Stability/Clinical Decision Making Evolving/Moderate complexity   ? Rehab Potential Good   ? PT Frequency 2x / week   ? PT Duration Other (comment)   7 weeks, including week of 01/25/22  ? PT Treatment/Interventions ADLs/Self Care Home Management;DME Instruction;Neuromuscular re-education;Balance training;Therapeutic exercise;Therapeutic activities;Functional mobility training;Gait training;Stair training;Patient/family education;Passive range of motion;Manual techniques   ? PT Next Visit Plan Starts/stops with gait; functional strength, balance, posture.  Address posture through functional activities (weightshifting, reaching, etc), environmental scanning tasks with gait   ? Consulted and Agree with Plan of Care Patient;Family  member/caregiver   ? Family Member Weldon Picking, caregiver   ? ?  ?  ? ?  ? ? ?Patient will  benefit from skilled therapeutic intervention in order to improve the following deficits and impairments:  Abnormal gait, Difficulty walking, Decreased balance, Decreased mobility, Decreased strength, Postural dysfunction ? ?Visit Diagnosis: ?Abnormal posture ? ?Muscle weakness (generalized) ? ?Unsteadiness on feet ? ?Other abnormalities of gait and mobility ? ? ? ? ?Problem List ?Patient Active Problem List  ? Diagnosis Date Noted  ? Nonrheumatic aortic valve stenosis 05/30/2017  ? COPD, mild ([place]) 06/05/2014  ? ? ?Yichen Gilardi W., PT ?02/02/2022, 11:25 AM ? ?[place] ?[facility] ?[address], [address] ?[place], [place], [address] ?Phone: [phone]   Fax:  [phone] ? ?Name: Valerie Villa ?MRN: [id number] ?Date of Birth: 05-05-1924 ? ? ? ?

## 2022-02-04 ENCOUNTER — Ambulatory Visit: Payer: PPO

## 2022-02-04 DIAGNOSIS — M6281 Muscle weakness (generalized): Secondary | ICD-10-CM

## 2022-02-04 DIAGNOSIS — R293 Abnormal posture: Secondary | ICD-10-CM | POA: Diagnosis not present

## 2022-02-04 DIAGNOSIS — R2681 Unsteadiness on feet: Secondary | ICD-10-CM

## 2022-02-04 DIAGNOSIS — R2689 Other abnormalities of gait and mobility: Secondary | ICD-10-CM

## 2022-02-04 NOTE — Therapy (Signed)
[place] ?[facility] ?[address], [address] ?[place], [place], [address] ?Phone: [phone]   Fax:  [phone] ? ?Physical Therapy Treatment ? ?Patient Details  ?Name: Valerie Villa ?MRN: [id number] ?Date of Birth: 01/09/1924 ?Referring Provider (PT): [place] ? ? ?Encounter Date: 02/04/2022 ? ? PT End of Session - 02/04/22 1021   ? ? Visit Number 13   ? Number of Visits 22   ? Date for PT Re-Evaluation 44/03/47   recert, including week of 01/25/2022  ? Authorization Type HTA   ? Progress Note Due on Visit 20   ? PT Start Time 1018   ? PT Stop Time 1100   ? PT Time Calculation (min) 42 min   ? Activity Tolerance Patient tolerated treatment well   ? Behavior During Therapy [facility] for tasks assessed/performed   ? ?  ?  ? ?  ? ? ?Past Medical History:  ?Diagnosis Date  ? Colon polyps   ? Hearing impairment   ? Hiatal hernia   ? Hx of basal cell carcinoma   ? Hypercholesteremia   ? Hypertension   ? Mild aortic stenosis   ? Osteoporosis   ? Seasonal allergies   ? ? ?No past surgical history on file. ? ?There were no vitals filed for this visit. ? ? Subjective Assessment - 02/04/22 1027   ? ? Subjective Neck is feeling sore and stiff today   ? Patient Stated Goals Pt's goals for therapy are to improve posture, balance.   ? Currently in Pain? Yes   ? Pain Score 6    ? Pain Location Neck   ? Pain Orientation Left   ? Pain Descriptors / Indicators Spasm;Sore   ? Pain Type Acute pain   ? ?  ?  ? ?  ? ? ? ? ? ? ? ? ? ? ? ? ? ? ? ? ? ? ? ? [place] Adult PT Treatment/Exercise - 02/04/22 0001   ? ?  ? Ambulation/Gait  ? Ambulation/Gait Yes   ? Ambulation/Gait Assistance 5: Supervision   ? Ambulation Distance (Feet) 170 Feet   ? Assistive device None   ? Gait Pattern Step-through pattern;Lateral trunk lean to left;Narrow base of support   ? Ambulation Surface Level   ? Gait Comments Short distance gait with head turns right and left, at counter, 10 ft x 6 reps; progressed to head turns with gait in gym for  environmental scanning.   ?  ? Manual Therapy  ? Manual Therapy Soft tissue mobilization   ? Manual therapy comments completed separate from othe rinterventions   ? Soft tissue mobilization soft tissue/joint mobilization/passive stretching/contract-relax to cervical spine to improve midline orientation and reduce pain/stiffness for improve mobility   ? ?  ?  ? ?  ? ? ? ? ? ? Balance Exercises - 02/04/22 0001   ? ?  ? Balance Exercises: Standing  ? Standing Eyes Opened Foam/compliant surface   lateral flexion, trunk rotations 10 reps  ? Standing Eyes Closed Foam/compliant surface;2 reps;30 secs   ? Marching Solid surface;Dynamic;20 reps   ? ?  ?  ? ?  ? ? ? ? ? ? ? PT Short Term Goals - 01/27/22 0847   ? ?  ? PT SHORT TERM GOAL #1  ? Title Pt will perform progression of HEP with family supervision for improved strength, balance, posture, and gait.  TARGET 02/12/2022   ? Time 3   ? Period Weeks   ?  Status Revised   ? ?  ?  ? ?  ? ? ? ? PT Long Term Goals - 01/26/22 1028   ? ?  ? PT LONG TERM GOAL #1  ? Title Pt will verbalize plans for continued community fitness upon d/c from PT.   TARGET 03/12/2022   ? Baseline supervision with current HEP, will benefit from additional updates   ? Time 7   ? Period Weeks   ? Status New   ?  ? PT LONG TERM GOAL #2  ? Title Pt will improve Berg score to at least 48/56 to decrease fall risk.   ? Baseline 43/56>45/56 01/26/2022   ? Time 7   ? Period Weeks   ? Status On-going   ?  ? PT LONG TERM GOAL #3  ? Title Patient will demonsrtated improved TUG </= 15 seconds to reduce fall risk   ? Baseline 22.84 sec; 16.41 sec 01/26/2022   ? Time 7   ? Period Weeks   ? Status On-going   ?  ? PT LONG TERM GOAL #4  ? Title Pt will improve Dynamic Gait Index to at least 16/24 for decreased fall risk.   ? Baseline 12/24   ? Time 7   ? Period Weeks   ? Status New   ? ?  ?  ? ?  ? ? ? ? ? ? ? ? Plan - 02/04/22 1110   ? ? Clinical Impression Statement INcreased balance efforts on compliant surfaces with  emphasis on weight shift and postural correction to improve mid line orientation. Difficulty in maintainint static balance eyes closed with postural sway/LOB EC on foam. Improving single limb support evident during marching activity with two instances of unsteadiness requiring CGA to correct. Continued sessions to improve balance and strength as well as postural re-education to reduce risk for falls   ? Personal Factors and Comorbidities Comorbidity 3+;Time since onset of injury/illness/exacerbation   5 week onset of balance difficulty, postural changes with increased left lateral lean.  ? Comorbidities See problem list   ? Examination-Activity Limitations Transfers;Locomotion Level;Stand   ? Examination-Participation Restrictions Community Activity;Shop   ? Stability/Clinical Decision Making Evolving/Moderate complexity   ? Rehab Potential Good   ? PT Frequency 2x / week   ? PT Duration Other (comment)   7 weeks, including week of 01/25/22  ? PT Treatment/Interventions ADLs/Self Care Home Management;DME Instruction;Neuromuscular re-education;Balance training;Therapeutic exercise;Therapeutic activities;Functional mobility training;Gait training;Stair training;Patient/family education;Passive range of motion;Manual techniques   ? PT Next Visit Plan Starts/stops with gait; functional strength, balance, posture.  Address posture through functional activities (weightshifting, reaching, etc), environmental scanning tasks with gait   ? Consulted and Agree with Plan of Care Patient;Family member/caregiver   ? Family Member Weldon Picking, caregiver   ? ?  ?  ? ?  ? ? ?Patient will benefit from skilled therapeutic intervention in order to improve the following deficits and impairments:  Abnormal gait, Difficulty walking, Decreased balance, Decreased mobility, Decreased strength, Postural dysfunction ? ?Visit Diagnosis: ?Abnormal posture ? ?Muscle weakness (generalized) ? ?Unsteadiness on feet ? ?Other abnormalities of gait  and mobility ? ? ? ? ?Problem List ?Patient Active Problem List  ? Diagnosis Date Noted  ? Nonrheumatic aortic valve stenosis 05/30/2017  ? COPD, mild ([place]) 06/05/2014  ? ? ?Toniann Fail, PT ?02/04/2022, 11:14 AM ? ?[place] ?[facility] ?[address], [address] ?[place], [place], [address] ?Phone: [phone]   Fax:  [phone] ? ?  Name: Valerie Villa ?MRN: [id number] ?Date of Birth: 08-23-1924 ? ? ? ?

## 2022-02-09 ENCOUNTER — Encounter: Payer: Self-pay | Admitting: Physical Therapy

## 2022-02-09 ENCOUNTER — Ambulatory Visit: Payer: PPO | Admitting: Physical Therapy

## 2022-02-09 DIAGNOSIS — R2681 Unsteadiness on feet: Secondary | ICD-10-CM

## 2022-02-09 DIAGNOSIS — M6281 Muscle weakness (generalized): Secondary | ICD-10-CM

## 2022-02-09 DIAGNOSIS — R293 Abnormal posture: Secondary | ICD-10-CM | POA: Diagnosis not present

## 2022-02-09 DIAGNOSIS — R2689 Other abnormalities of gait and mobility: Secondary | ICD-10-CM

## 2022-02-09 NOTE — Therapy (Signed)
[place] ?[facility] ?[address], [address] ?[place], [place], [address] ?Phone: [phone]   Fax:  [phone] ? ?Physical Therapy Treatment ? ?Patient Details  ?Name: Valerie Villa ?MRN: [id number] ?Date of Birth: [birth year] ?Referring Provider (PT): [place] ? ? ?Encounter Date: 02/09/2022 ? ? PT End of Session - 02/09/22 1022   ? ? Visit Number 14   ? Number of Visits 22   ? Date for PT Re-Evaluation 08/67/61   recert, including week of 01/25/2022  ? Authorization Type HTA   ? Progress Note Due on Visit 20   ? PT Start Time 1020   ? PT Stop Time 1100   ? PT Time Calculation (min) 40 min   ? Activity Tolerance Patient tolerated treatment well   ? Behavior During Therapy [facility] for tasks assessed/performed   ? ?  ?  ? ?  ? ? ?Past Medical History:  ?Diagnosis Date  ? Colon polyps   ? Hearing impairment   ? Hiatal hernia   ? Hx of basal cell carcinoma   ? Hypercholesteremia   ? Hypertension   ? Mild aortic stenosis   ? Osteoporosis   ? Seasonal allergies   ? ? ?History reviewed. No pertinent surgical history. ? ?There were no vitals filed for this visit. ? ? Subjective Assessment - 02/09/22 1036   ? ? Subjective No c/o, no changes.  Have a birthday coming up this weekend!   ? Patient Stated Goals Pt's goals for therapy are to improve posture, balance.   ? Currently in Pain? No/denies   ? ?  ?  ? ?  ? ? ? ? ? ? ? ? ? ? ? ? ? ? ? ? ? ? ? ? [place] Adult PT Treatment/Exercise - 02/09/22 0001   ? ?  ? Transfers  ? Transfers Sit to Stand;Stand to Sit   ? Sit to Stand 6: Modified independent (Device/Increase time);Without upper extremity assist;From bed   ? Stand to Sit 6: Modified independent (Device/Increase time);Without upper extremity assist;To bed   ? Number of Reps 2 sets;10 reps   ? Comments holding ball, reaching overhead upon standing   ?  ? Ambulation/Gait  ? Gait Comments Short distance gait with turns around furniture, no LOB   ?  ? Exercises  ? Exercises Other Exercises   ? Other  Exercises  Standing posture exercises working on weightshifting laterally (high, shoulder leve, and low) with reaching, looking to hand for active neck motion, 10 reps each near wall. Seated posture exercises, hands on knees to open wide posture x 10, then seconds set of 10 using ball/following ball with head turns, x 10 reps. Forward lean to upright posture, 2 x 10 reps.  Diagonal reach with ball knee >shoulder, x 10.   ? ?  ?  ? ?  ? ? ? ? ? ? Balance Exercises - 02/09/22 0001   ? ?  ? Balance Exercises: Standing  ? SLS Eyes open;Upper extremity support 2;Upper extremity support 1;3 reps;10 secs   ? Sidestepping Upper extremity support;Limitations   ? Sidestepping Limitations Sidestep R and L, 10 reps each side, x 2 sets, cues to look ahead at target   ? Marching Solid surface;Dynamic;20 reps   ? Other Standing Exercises Comments Attempted SLS with no UE support:  2-2.5 sec with hovering hands on parallel bar, 2-3 reps.   ? ?  ?  ? ?  ? ? ? ? ? ? ?  PT Short Term Goals - 01/27/22 0847   ? ?  ? PT SHORT TERM GOAL #1  ? Title Pt will perform progression of HEP with family supervision for improved strength, balance, posture, and gait.  TARGET 02/12/2022   ? Time 3   ? Period Weeks   ? Status Revised   ? ?  ?  ? ?  ? ? ? ? PT Long Term Goals - 01/26/22 1028   ? ?  ? PT LONG TERM GOAL #1  ? Title Pt will verbalize plans for continued community fitness upon d/c from PT.   TARGET 03/12/2022   ? Baseline supervision with current HEP, will benefit from additional updates   ? Time 7   ? Period Weeks   ? Status New   ?  ? PT LONG TERM GOAL #2  ? Title Pt will improve Berg score to at least 48/56 to decrease fall risk.   ? Baseline 43/56>45/56 01/26/2022   ? Time 7   ? Period Weeks   ? Status On-going   ?  ? PT LONG TERM GOAL #3  ? Title Patient will demonsrtated improved TUG </= 15 seconds to reduce fall risk   ? Baseline 22.84 sec; 16.41 sec 01/26/2022   ? Time 7   ? Period Weeks   ? Status On-going   ?  ? PT LONG TERM GOAL #4  ?  Title Pt will improve Dynamic Gait Index to at least 16/24 for decreased fall risk.   ? Baseline 12/24   ? Time 7   ? Period Weeks   ? Status New   ? ?  ?  ? ?  ? ? ? ? ? ? ? ? Plan - 02/09/22 1037   ? ? Clinical Impression Statement Skilled PT session focused on seated, standing posture exercise as well as balance exercises with focus on more single limb stance.  Pt mostly requires at least 1 UE support with single limb stance activities, with decreased hip stability on L side.  She does well with standing weightshifting, reaching exercises incorporating active neck motion.  She will continue to benefit from skilled PT towards goals for improved overall functional mobility and decreased fall risk.   ? Personal Factors and Comorbidities Comorbidity 3+;Time since onset of injury/illness/exacerbation   5 week onset of balance difficulty, postural changes with increased left lateral lean.  ? Comorbidities See problem list   ? Examination-Activity Limitations Transfers;Locomotion Level;Stand   ? Examination-Participation Restrictions Community Activity;Shop   ? Stability/Clinical Decision Making Evolving/Moderate complexity   ? Rehab Potential Good   ? PT Frequency 2x / week   ? PT Duration Other (comment)   7 weeks, including week of 01/25/22  ? PT Treatment/Interventions ADLs/Self Care Home Management;DME Instruction;Neuromuscular re-education;Balance training;Therapeutic exercise;Therapeutic activities;Functional mobility training;Gait training;Stair training;Patient/family education;Passive range of motion;Manual techniques   ? PT Next Visit Plan Check STGs next visit and continue towards LTGs.  Starts/stops with gait; functional strength, balance, posture.  Address posture through functional activities (weightshifting, reaching, etc), environmental scanning tasks with gait   ? Consulted and Agree with Plan of Care Patient;Family member/caregiver   ? Family Member Weldon Picking, caregiver   ? ?  ?  ? ?  ? ? ?Patient  will benefit from skilled therapeutic intervention in order to improve the following deficits and impairments:  Abnormal gait, Difficulty walking, Decreased balance, Decreased mobility, Decreased strength, Postural dysfunction ? ?Visit Diagnosis: ?Abnormal posture ? ?Muscle weakness (generalized) ? ?Unsteadiness on  feet ? ?Other abnormalities of gait and mobility ? ? ? ? ?Problem List ?Patient Active Problem List  ? Diagnosis Date Noted  ? Nonrheumatic aortic valve stenosis 05/30/2017  ? COPD, mild ([place]) 06/05/2014  ? ? ?Munira Polson W., PT ?02/09/2022, 12:00 PM ? ?[place] ?[facility] ?[address], [address] ?[place], [place], [address] ?Phone: [phone]   Fax:  [phone] ? ?Name: Valerie Villa ?MRN: [id number] ?Date of Birth: 04-08-1924 ? ? ? ?

## 2022-02-11 ENCOUNTER — Ambulatory Visit: Payer: PPO

## 2022-02-11 DIAGNOSIS — R293 Abnormal posture: Secondary | ICD-10-CM | POA: Diagnosis not present

## 2022-02-11 DIAGNOSIS — R2681 Unsteadiness on feet: Secondary | ICD-10-CM

## 2022-02-11 DIAGNOSIS — R2689 Other abnormalities of gait and mobility: Secondary | ICD-10-CM

## 2022-02-11 DIAGNOSIS — M6281 Muscle weakness (generalized): Secondary | ICD-10-CM

## 2022-02-11 NOTE — Therapy (Signed)
[place] ?[facility] ?[address], [address] ?[place], [place], [address] ?Phone: [phone]   Fax:  [phone] ? ?Physical Therapy Treatment ? ?Patient Details  ?Name: Valerie Villa ?MRN: [id number] ?Date of Birth: 12-31-1923 ?Referring Provider (PT): [place] ? ? ?Encounter Date: 02/11/2022 ? ? PT End of Session - 02/11/22 1148   ? ? Visit Number 15   ? Number of Visits 22   ? Date for PT Re-Evaluation 74/08/14   recert, including week of 01/25/2022  ? Authorization Type HTA   ? Progress Note Due on Visit 20   ? PT Start Time 1145   ? PT Stop Time 1230   ? PT Time Calculation (min) 45 min   ? Activity Tolerance Patient tolerated treatment well   ? Behavior During Therapy [facility] for tasks assessed/performed   ? ?  ?  ? ?  ? ? ?Past Medical History:  ?Diagnosis Date  ? Colon polyps   ? Hearing impairment   ? Hiatal hernia   ? Hx of basal cell carcinoma   ? Hypercholesteremia   ? Hypertension   ? Mild aortic stenosis   ? Osteoporosis   ? Seasonal allergies   ? ? ?No past surgical history on file. ? ?There were no vitals filed for this visit. ? ? Subjective Assessment - 02/11/22 1154   ? ? Subjective Have not been making it to exercise classes as of late. Working on improving balance   ? Patient Stated Goals Pt's goals for therapy are to improve posture, balance.   ? Currently in Pain? No/denies   ? Pain Score 0-No pain   ? ?  ?  ? ?  ? ? ? ? ? ? ? ? ? ? ? ? ? ? ? ? ? ? ? ? ? ? ? ? ? Balance Exercises - 02/11/22 0001   ? ?  ? Balance Exercises: Standing  ? Standing Eyes Opened Narrow base of support (BOS);Wide (BOA);Head turns;Foam/compliant surface;3 reps;30 secs   ? Standing Eyes Closed Narrow base of support (BOS);Wide (BOA);Head turns;Foam/compliant surface;2 reps;30 secs   ? Tandem Gait Forward;Intermittent upper extremity support;4 reps   20 ft  ? Retro Gait Other (comment)   4x20 ft  ? Sidestepping Other (comment)   4x20 ft  ? Marching Solid surface;Dynamic   4x20 ft  ? ?  ?  ? ?   ? ? ? ? ? ? ? PT Short Term Goals - 01/27/22 0847   ? ?  ? PT SHORT TERM GOAL #1  ? Title Pt will perform progression of HEP with family supervision for improved strength, balance, posture, and gait.  TARGET 02/12/2022   ? Time 3   ? Period Weeks   ? Status Revised   ? ?  ?  ? ?  ? ? ? ? PT Long Term Goals - 01/26/22 1028   ? ?  ? PT LONG TERM GOAL #1  ? Title Pt will verbalize plans for continued community fitness upon d/c from PT.   TARGET 03/12/2022   ? Baseline supervision with current HEP, will benefit from additional updates   ? Time 7   ? Period Weeks   ? Status New   ?  ? PT LONG TERM GOAL #2  ? Title Pt will improve Berg score to at least 48/56 to decrease fall risk.   ? Baseline 43/56>45/56 01/26/2022   ? Time 7   ? Period Weeks   ?  Status On-going   ?  ? PT LONG TERM GOAL #3  ? Title Patient will demonsrtated improved TUG </= 15 seconds to reduce fall risk   ? Baseline 22.84 sec; 16.41 sec 01/26/2022   ? Time 7   ? Period Weeks   ? Status On-going   ?  ? PT LONG TERM GOAL #4  ? Title Pt will improve Dynamic Gait Index to at least 16/24 for decreased fall risk.   ? Baseline 12/24   ? Time 7   ? Period Weeks   ? Status New   ? ?  ?  ? ?  ? ? ? ? ? ? ? ? Plan - 02/11/22 1302   ? ? Clinical Impression Statement Continued with POC details to improve static and dynamic balance to facilitate righting reactions to reduce risk for falls with tendency for retro-LOB on compliant surfaces and decrease in amplitude of stepping strategy noted for corrective balance. Progressed very well with balance on compliant surfaces initially experiencing retro-LOB each trial/task with improved performance by end of session for correcting LOB and maintaining limits of stability. Continued sessions to progress postural strength, dynamic balance, and activity tolerance to reduce risk for falls   ? Personal Factors and Comorbidities Comorbidity 3+;Time since onset of injury/illness/exacerbation   5 week onset of balance difficulty,  postural changes with increased left lateral lean.  ? Comorbidities See problem list   ? Examination-Activity Limitations Transfers;Locomotion Level;Stand   ? Examination-Participation Restrictions Community Activity;Shop   ? Stability/Clinical Decision Making Evolving/Moderate complexity   ? Rehab Potential Good   ? PT Frequency 2x / week   ? PT Duration Other (comment)   7 weeks, including week of 01/25/22  ? PT Treatment/Interventions ADLs/Self Care Home Management;DME Instruction;Neuromuscular re-education;Balance training;Therapeutic exercise;Therapeutic activities;Functional mobility training;Gait training;Stair training;Patient/family education;Passive range of motion;Manual techniques   ? PT Next Visit Plan Check STGs next visit and continue towards LTGs.  Starts/stops with gait; functional strength, balance, posture.  Address posture through functional activities (weightshifting, reaching, etc), environmental scanning tasks with gait   ? Consulted and Agree with Plan of Care Patient;Family member/caregiver   ? Family Member Weldon Picking, caregiver   ? ?  ?  ? ?  ? ? ?Patient will benefit from skilled therapeutic intervention in order to improve the following deficits and impairments:  Abnormal gait, Difficulty walking, Decreased balance, Decreased mobility, Decreased strength, Postural dysfunction ? ?Visit Diagnosis: ?Abnormal posture ? ?Muscle weakness (generalized) ? ?Unsteadiness on feet ? ?Other abnormalities of gait and mobility ? ? ? ? ?Problem List ?Patient Active Problem List  ? Diagnosis Date Noted  ? Nonrheumatic aortic valve stenosis 05/30/2017  ? COPD, mild ([place]) 06/05/2014  ? ? ?Toniann Fail, PT ?02/11/2022, 1:05 PM ? ? ?[facility] ?[address], [address] ?[place], [place], [address] ?Phone: [phone]   Fax:  [phone] ? ?Name: Valerie Villa ?MRN: [id number] ?Date of Birth: [birth year] ? ? ? ?

## 2022-02-17 ENCOUNTER — Ambulatory Visit: Payer: PPO | Admitting: Physical Therapy

## 2022-02-17 ENCOUNTER — Encounter: Payer: Self-pay | Admitting: Physical Therapy

## 2022-02-17 DIAGNOSIS — R2689 Other abnormalities of gait and mobility: Secondary | ICD-10-CM

## 2022-02-17 DIAGNOSIS — M6281 Muscle weakness (generalized): Secondary | ICD-10-CM

## 2022-02-17 DIAGNOSIS — R293 Abnormal posture: Secondary | ICD-10-CM | POA: Diagnosis not present

## 2022-02-17 DIAGNOSIS — R2681 Unsteadiness on feet: Secondary | ICD-10-CM

## 2022-02-17 NOTE — Therapy (Signed)
[place] ?[facility] ?[address], [address] ?[place], [place], [address] ?Phone: [phone]   Fax:  [phone] ? ?Physical Therapy Treatment ? ?Patient Details  ?Name: Valerie Villa ?MRN: [id number] ?Date of Birth: 09/03/1924 ?Referring Provider (PT): [place] ? ? ?Encounter Date: 02/17/2022 ? ? PT End of Session - 02/17/22 1318   ? ? Visit Number 16   ? Number of Visits 22   ? Date for PT Re-Evaluation 84/66/59   recert, including week of 01/25/2022  ? Authorization Type HTA   ? Progress Note Due on Visit 20   ? PT Start Time 1318   ? PT Stop Time 9357   ? PT Time Calculation (min) 41 min   ? Activity Tolerance Patient tolerated treatment well   ? Behavior During Therapy [facility] for tasks assessed/performed   ? ?  ?  ? ?  ? ? ?Past Medical History:  ?Diagnosis Date  ? Colon polyps   ? Hearing impairment   ? Hiatal hernia   ? Hx of basal cell carcinoma   ? Hypercholesteremia   ? Hypertension   ? Mild aortic stenosis   ? Osteoporosis   ? Seasonal allergies   ? ? ?History reviewed. No pertinent surgical history. ? ?There were no vitals filed for this visit. ? ? Subjective Assessment - 02/17/22 1318   ? ? Subjective Nothing new.  Celebrated my birthday with my daughter.   ? Patient Stated Goals Pt's goals for therapy are to improve posture, balance.   ? Currently in Pain? No/denies   ? ?  ?  ? ?  ? ? ? ? ? ? ? ? ? ? ? ? ? ? ? ? ? ? ? ? [place] Adult PT Treatment/Exercise - 02/17/22 0001   ? ?  ? Ambulation/Gait  ? Ambulation/Gait Yes   ? Ambulation/Gait Assistance 5: Supervision   ? Ambulation Distance (Feet) 160 Feet   50 x 2  ? Assistive device None   ? Gait Pattern Step-through pattern;Lateral trunk lean to left;Narrow base of support   ? Ambulation Surface Level   ? Gait Comments Cues for arm swing with gait, lessens psoterior bias with gait   ?  ? Exercises  ? Exercises Other Exercises   ? Other Exercises  Standing and seated trunk rotation, looking at reaching hand (reaching behind) x 10  reps each.  SEated forward lean>upright sit posture x 10, standing reach up and across to look at reaching hand, x 5 reps.   ? ?  ?  ? ?  ? ? ? ? ? ? Balance Exercises - 02/17/22 0001   ? ?  ? Balance Exercises: Standing  ? Balance Beam Standing on beam-forward step taps/return to midline x10, side step taps/return to midline x 10, back step and weightshift/return to midline x 10, BUE support.  Forward>back step and weightshift 2 x 10 reps; then sidestep/together x 10 reps   ? Sidestepping 5 reps;Limitations   ? Sidestepping Limitations R and L in parallel bars   ? Marching Solid surface;Dynamic;Static;20 reps   min guard assist with dynamic balance; 20 ft x 4 forward marching  ? ?  ?  ? ?  ? ? ? ? ? ? ? PT Short Term Goals - 02/17/22 1410   ? ?  ? PT SHORT TERM GOAL #1  ? Title Pt will perform progression of HEP with family supervision for improved strength, balance, posture, and gait.  TARGET 02/12/2022   ?  Baseline met per caregiver report   ? Time 3   ? Period Weeks   ? Status Achieved   ? ?  ?  ? ?  ? ? ? ? PT Long Term Goals - 02/17/22 1410   ? ?  ? PT LONG TERM GOAL #1  ? Title Pt will verbalize plans for continued community fitness upon d/c from PT.   TARGET 03/12/2022   ? Baseline supervision with current HEP, will benefit from additional updates   ? Time 7   ? Period Weeks   ? Status New   ?  ? PT LONG TERM GOAL #2  ? Title Pt will improve Berg score to at least 48/56 to decrease fall risk.   ? Baseline 43/56>45/56 01/26/2022   ? Time 7   ? Period Weeks   ? Status On-going   ?  ? PT LONG TERM GOAL #3  ? Title Patient will demonsrtated improved TUG </= 15 seconds to reduce fall risk   ? Baseline 22.84 sec; 16.41 sec 01/26/2022   ? Time 7   ? Period Weeks   ? Status On-going   ?  ? PT LONG TERM GOAL #4  ? Title Pt will improve Dynamic Gait Index to at least 16/24 for decreased fall risk.   ? Baseline 12/24   ? Time 7   ? Period Weeks   ? Status New   ? ?  ?  ? ?  ? ? ? ? ? ? ? ? Plan - 02/17/22 1406   ? ?  Clinical Impression Statement Focused most of today's session on compliant surface balance activities, also postural exercise with combined head motions.  Pt only has one minor LOB with turning corner with gait, with some posterior bias with slowed pace of gait.  When cued to add arm swing to gait, she demo improved balance and posture with gait.  She will be out of town on family vacation and will return to PT on 03/03/2022.  Caregiver does verbalize they perform HEP, especially some of the balance exercises at home.   ? Personal Factors and Comorbidities Comorbidity 3+;Time since onset of injury/illness/exacerbation   5 week onset of balance difficulty, postural changes with increased left lateral lean.  ? Comorbidities See problem list   ? Examination-Activity Limitations Transfers;Locomotion Level;Stand   ? Examination-Participation Restrictions Community Activity;Shop   ? Stability/Clinical Decision Making Evolving/Moderate complexity   ? Rehab Potential Good   ? PT Frequency 2x / week   ? PT Duration Other (comment)   7 weeks, including week of 01/25/22  ? PT Treatment/Interventions ADLs/Self Care Home Management;DME Instruction;Neuromuscular re-education;Balance training;Therapeutic exercise;Therapeutic activities;Functional mobility training;Gait training;Stair training;Patient/family education;Passive range of motion;Manual techniques   ? PT Next Visit Plan Work towards [place] (need to look at POC/add appts?) Starts/stops with gait; functional strength, balance, posture.  Address posture through functional activities (weightshifting, reaching, etc), environmental scanning tasks with gait   ? Consulted and Agree with Plan of Care Patient;Family member/caregiver   ? Family Member Weldon Picking, caregiver   ? ?  ?  ? ?  ? ? ?Patient will benefit from skilled therapeutic intervention in order to improve the following deficits and impairments:  Abnormal gait, Difficulty walking, Decreased balance, Decreased  mobility, Decreased strength, Postural dysfunction ? ?Visit Diagnosis: ?Muscle weakness (generalized) ? ?Unsteadiness on feet ? ?Abnormal posture ? ?Other abnormalities of gait and mobility ? ? ? ? ?Problem List ?Patient Active Problem List  ? Diagnosis Date Noted  ?  Nonrheumatic aortic valve stenosis 05/30/2017  ? COPD, mild ([place]) 06/05/2014  ? ? ?Valerie Altadonna W., PT ?02/17/2022, 2:10 PM ? ?[place] ?[facility] ?[address], [address] ?[place], [place], [address] ?Phone: [phone]   Fax:  [phone] ? ?Name: KAHMYA PINKHAM ?MRN: [id number] ?Date of Birth: 1923/10/15 ? ? ? ?

## 2022-03-03 ENCOUNTER — Ambulatory Visit: Payer: PPO | Admitting: Physical Therapy

## 2022-03-03 ENCOUNTER — Encounter: Payer: Self-pay | Admitting: Physical Therapy

## 2022-03-03 DIAGNOSIS — R293 Abnormal posture: Secondary | ICD-10-CM | POA: Diagnosis not present

## 2022-03-03 DIAGNOSIS — R2681 Unsteadiness on feet: Secondary | ICD-10-CM

## 2022-03-03 DIAGNOSIS — M6281 Muscle weakness (generalized): Secondary | ICD-10-CM

## 2022-03-03 DIAGNOSIS — R2689 Other abnormalities of gait and mobility: Secondary | ICD-10-CM

## 2022-03-03 NOTE — Therapy (Signed)
[facility] [address], [place], [place], [address] Phone: [phone]   Fax:  [phone]  Physical Therapy Treatment  Patient Details  Name: Valerie Villa MRN: [id number] Date of Birth: 1924/05/01 Referring Provider (PT): [place]   Encounter Date: 03/03/2022   PT End of Session - 03/03/22 1315     Visit Number 17    Number of Visits 22    Date for PT Re-Evaluation 32/99/24   recert, including week of 01/25/2022   Authorization Type HTA    Progress Note Due on Visit 20    PT Start Time 1318    PT Stop Time 2683    PT Time Calculation (min) 40 min    Activity Tolerance Patient tolerated treatment well    Behavior During Therapy [facility] for tasks assessed/performed             Past Medical History:  Diagnosis Date   Colon polyps    Hearing impairment    Hiatal hernia    Hx of basal cell carcinoma    Hypercholesteremia    Hypertension    Mild aortic stenosis    Osteoporosis    Seasonal allergies     History reviewed. No pertinent surgical history.  There were no vitals filed for this visit.   Subjective Assessment - 03/03/22 1317     Subjective Caregiver reports she is a lot straighter since the beach.  Had a slip while I was there; nothing else was hurt, just my fingernail.    Patient Stated Goals Pt's goals for therapy are to improve posture, balance.    Currently in Pain? No/denies                               [facility] Adult PT Treatment/Exercise - 03/03/22 0001       Transfers   Transfers Sit to Stand;Stand to Sit    Sit to Stand 6: Modified independent (Device/Increase time);Without upper extremity assist;From bed;5: Supervision    Stand to Sit 6: Modified independent (Device/Increase time);5: Supervision;With upper extremity assist;To bed    Number of Reps 2 sets;10 reps   2nd set, standing on Airex     High Level Balance   High Level Balance Activities Figure 8 turns;Negotitating  around obstacles;Negotiating over obstacles    High Level Balance Comments Figure-8 turns around obstacles, x 4 reps, with cues for widened BOS, with supervision.  Stepping over succession of obstacles, 3 reps, with min guard assist and cues for increased foot clearance over obstacles.                 Balance Exercises - 03/03/22 0001       Balance Exercises: Standing   Heel Raises Both;20 reps    Toe Raise Both;20 reps    Other Standing Exercises STanding on Airex:  marching in place 2 x 10, alternating forward taps>return to Airex, 2 x 10, alternating back step taps>return to Airex, 2 x 10 reps.  Forward>back step and weightshift x 10 reps.  BUE support.  On airex:  alternating side step taps>return to midline x 10.  Alt taps to cones, 20 reps BUE support    Other Standing Exercises Comments In parallel bars:  toe walking x 6 reps, heel walking x 4 reps                  PT Short Term Goals - 02/17/22  [place] #1   Title Pt will perform progression of HEP with family supervision for improved strength, balance, posture, and gait.  TARGET 02/12/2022    Baseline met per caregiver report    Time 3    Period Weeks    Status Achieved               PT Long Term Goals - 02/17/22 1410       PT LONG TERM GOAL #1   Title Pt will verbalize plans for continued community fitness upon d/c from PT.   TARGET 03/12/2022    Baseline supervision with current HEP, will benefit from additional updates    Time 7    Period Weeks    Status New      PT LONG TERM GOAL #2   Title Pt will improve Berg score to at least 48/56 to decrease fall risk.    Baseline 43/56>45/56 01/26/2022    Time 7    Period Weeks    Status On-going      PT LONG TERM GOAL #3   Title Patient will demonsrtated improved TUG </= 15 seconds to reduce fall risk    Baseline 22.84 sec; 16.41 sec 01/26/2022    Time 7    Period Weeks    Status On-going      PT LONG TERM GOAL #4   Title Pt  will improve Dynamic Gait Index to at least 16/24 for decreased fall risk.    Baseline 12/24    Time 7    Period Weeks    Status New                   Plan - 03/03/22 1507     Clinical Impression Statement Skilled PT session today focused on compliant surface balance and turning/obstacle negotiation.  Pt needs occasional cues for foot clearance with stepping activities on compliant surfaces.  With obstacle negotiation and figure-8 turns, she needs supervision/min guard assist, with narrowed BOS with turns and decreased foot clearance stepping over obstacles.  With cues and repetition, she is able to improve.  *Of note, pt's daughter called and spoke to PT today and is inquiring about speech therapy for word finding/?expressive aphasia episodes.  Will encourage her to speak to MD to request order.  She will continue to benefit from skilled PT towards goals for imrpoved overall functional mobility and decreased fall risk.    Personal Factors and Comorbidities Comorbidity 3+;Time since onset of injury/illness/exacerbation   5 week onset of balance difficulty, postural changes with increased left lateral lean.   Comorbidities See problem list    Examination-Activity Limitations Transfers;Locomotion Level;Stand    Examination-Participation Restrictions Community Activity;Shop    Stability/Clinical Decision Making Evolving/Moderate complexity    Rehab Potential Good    PT Frequency 2x / week    PT Duration Other (comment)   7 weeks, including week of 01/25/22   PT Treatment/Interventions ADLs/Self Care Home Management;DME Instruction;Neuromuscular re-education;Balance training;Therapeutic exercise;Therapeutic activities;Functional mobility training;Gait training;Stair training;Patient/family education;Passive range of motion;Manual techniques    PT Next Visit Plan Pt has this week and next in POC; discuss POC and possible d/c?; work on functional strength, dynamic balance and gait.  Follow up  with daughter about speech therapy referral    Consulted and Agree with Plan of Care Patient;Family member/caregiver    Family Member Weldon Picking, caregiver             Patient  will benefit from skilled therapeutic intervention in order to improve the following deficits and impairments:  Abnormal gait, Difficulty walking, Decreased balance, Decreased mobility, Decreased strength, Postural dysfunction  Visit Diagnosis: Unsteadiness on feet  Muscle weakness (generalized)  Abnormal posture  Other abnormalities of gait and mobility     Problem List Patient Active Problem List   Diagnosis Date Noted   Nonrheumatic aortic valve stenosis 05/30/2017   COPD, mild ([place]) 06/05/2014    Kisean Rollo W., PT 03/03/2022, 3:10 PM  [facility] [address], [place], [place], [address] Phone: [phone]   Fax:  [phone]  Name: Valerie Villa MRN: [id number] Date of Birth: 04/11/1924

## 2022-03-05 ENCOUNTER — Encounter: Payer: Self-pay | Admitting: Physical Therapy

## 2022-03-05 ENCOUNTER — Ambulatory Visit: Payer: PPO | Attending: Geriatric Medicine | Admitting: Physical Therapy

## 2022-03-05 DIAGNOSIS — M6281 Muscle weakness (generalized): Secondary | ICD-10-CM | POA: Insufficient documentation

## 2022-03-05 DIAGNOSIS — R293 Abnormal posture: Secondary | ICD-10-CM | POA: Insufficient documentation

## 2022-03-05 DIAGNOSIS — R2681 Unsteadiness on feet: Secondary | ICD-10-CM | POA: Diagnosis not present

## 2022-03-05 DIAGNOSIS — R2689 Other abnormalities of gait and mobility: Secondary | ICD-10-CM | POA: Diagnosis not present

## 2022-03-06 NOTE — Therapy (Signed)
[facility] [address], [place], [place], [address] Phone: [phone]   Fax:  [phone]  Physical Therapy Treatment  Patient Details  Name: Valerie Villa MRN: [id number] Date of Birth: [birth year] Referring Provider (PT): [place]   Encounter Date: 03/05/2022   PT End of Session - 03/05/22 1024     Visit Number 18    Number of Visits 22    Date for PT Re-Evaluation 71/06/26   recert, including week of 01/25/2022   Authorization Type HTA    Progress Note Due on Visit 20    PT Start Time 1022    PT Stop Time 1100    PT Time Calculation (min) 38 min    Activity Tolerance Patient tolerated treatment well    Behavior During Therapy [facility] for tasks assessed/performed             Past Medical History:  Diagnosis Date   Colon polyps    Hearing impairment    Hiatal hernia    Hx of basal cell carcinoma    Hypercholesteremia    Hypertension    Mild aortic stenosis    Osteoporosis    Seasonal allergies     History reviewed. No pertinent surgical history.  There were no vitals filed for this visit.                      [facility] Adult PT Treatment/Exercise - 03/05/22 1022       Transfers   Transfers Sit to Stand;Stand to Sit    Sit to Stand 6: Modified independent (Device/Increase time);Without upper extremity assist;From bed;5: Supervision    Stand to Sit 6: Modified independent (Device/Increase time);5: Supervision;With upper extremity assist;To bed    Number of Reps 2 sets;10 reps   2nd set standing on Airex     Ambulation/Gait   Ambulation/Gait Yes    Ambulation/Gait Assistance 5: Supervision    Ambulation Distance (Feet) 350 Feet   outdoor surfaces   Assistive device None    Gait Pattern Step-through pattern;Lateral trunk lean to left;Narrow base of support    Ambulation Surface Level;Outdoor;Indoor;Unlevel    Gait Comments Indoor gait, short distances (20 ft x 4 reps) with head turns/head  nods-slowed pace,1 LOB with R foot catching floor; outdoor gait on patio area, supervision with conversation tasks (slowed slightly with conversation tasks and mild sway/veering with unlevel areas)      High Level Balance   High Level Balance Activities Figure 8 turns;Negotitating around obstacles;Negotiating over obstacles    High Level Balance Comments Figure-8 turns around obstacles, x 4 reps, with cues for widened BOS, with supervision.  Stepping over succession of obstacles, 3 reps, with min guard assist and cues for increased foot clearance over obstacles.                 Balance Exercises - 03/05/22 1025       Balance Exercises: Standing   Other Standing Exercises STanding on Airex, at counter, LUE support:  marching in place 3 x 10, alternating forward taps>return to Airex, 3 x 10,; with BUE support:  alternating back step taps>return to Airex, 3 x 10 reps.  alternating side step taps>return to midline x 10.                  PT Short Term Goals - 02/17/22 1410       PT SHORT TERM GOAL #1   Title Pt will perform progression  of HEP with family supervision for improved strength, balance, posture, and gait.  TARGET 02/12/2022    Baseline met per caregiver report    Time 3    Period Weeks    Status Achieved               PT Long Term Goals - 02/17/22 1410       PT LONG TERM GOAL #1   Title Pt will verbalize plans for continued community fitness upon d/c from PT.   TARGET 03/12/2022    Baseline supervision with current HEP, will benefit from additional updates    Time 7    Period Weeks    Status New      PT LONG TERM GOAL #2   Title Pt will improve Berg score to at least 48/56 to decrease fall risk.    Baseline 43/56>45/56 01/26/2022    Time 7    Period Weeks    Status On-going      PT LONG TERM GOAL #3   Title Patient will demonsrtated improved TUG </= 15 seconds to reduce fall risk    Baseline 22.84 sec; 16.41 sec 01/26/2022    Time 7    Period  Weeks    Status On-going      PT LONG TERM GOAL #4   Title Pt will improve Dynamic Gait Index to at least 16/24 for decreased fall risk.    Baseline 12/24    Time 7    Period Weeks    Status New                   Plan - 03/06/22 1316     Clinical Impression Statement Skilled PT session today focused on compliant surface balance activities, able to increase to 3 sets of 10 for most exercises.  She continues to need light UE support for compliant surface exercise activities and close supervision/occasional min guard for outdoor/unlevel sidewalk surface gait.  She continues to benefit from skilled PT to address posture, balance, strength and gait for overall imrpoved mobility and decreased fall risk.    Personal Factors and Comorbidities Comorbidity 3+;Time since onset of injury/illness/exacerbation   5 week onset of balance difficulty, postural changes with increased left lateral lean.   Comorbidities See problem list    Examination-Activity Limitations Transfers;Locomotion Level;Stand    Examination-Participation Restrictions Community Activity;Shop    Stability/Clinical Decision Making Evolving/Moderate complexity    Rehab Potential Good    PT Frequency 2x / week    PT Duration Other (comment)   7 weeks, including week of 01/25/22   PT Treatment/Interventions ADLs/Self Care Home Management;DME Instruction;Neuromuscular re-education;Balance training;Therapeutic exercise;Therapeutic activities;Functional mobility training;Gait training;Stair training;Patient/family education;Passive range of motion;Manual techniques    PT Next Visit Plan Begin checking goals, discuss/review HEP and community classes.  Discuss POC and possible d/c?; work on functional strength, dynamic balance and gait.  Follow up with daughter about speech therapy referral    Consulted and Agree with Plan of Care Patient;Family member/caregiver    Family Member [employer], caregiver             Patient  will benefit from skilled therapeutic intervention in order to improve the following deficits and impairments:  Abnormal gait, Difficulty walking, Decreased balance, Decreased mobility, Decreased strength, Postural dysfunction  Visit Diagnosis: Unsteadiness on feet  Muscle weakness (generalized)  Other abnormalities of gait and mobility  Abnormal posture     Problem List Patient Active Problem List   Diagnosis Date Noted  Nonrheumatic aortic valve stenosis 05/30/2017   COPD, mild ([place]) 06/05/2014    Fardeen Steinberger W., PT 03/06/2022, 1:18 PM  [facility] [address] [place], [place], [address] Phone: [phone]   Fax:  [phone]  Name: NYELLI SAMARA MRN: [id number] Date of Birth: 09-28-1924

## 2022-03-08 ENCOUNTER — Encounter: Payer: Self-pay | Admitting: Physical Therapy

## 2022-03-08 ENCOUNTER — Ambulatory Visit: Payer: PPO | Admitting: Physical Therapy

## 2022-03-08 DIAGNOSIS — R2681 Unsteadiness on feet: Secondary | ICD-10-CM

## 2022-03-08 DIAGNOSIS — R293 Abnormal posture: Secondary | ICD-10-CM

## 2022-03-08 DIAGNOSIS — R2689 Other abnormalities of gait and mobility: Secondary | ICD-10-CM

## 2022-03-08 DIAGNOSIS — M6281 Muscle weakness (generalized): Secondary | ICD-10-CM

## 2022-03-08 NOTE — Therapy (Signed)
[facility] [address] [place], [place], [address] Phone: [phone]   Fax:  [phone]  Physical Therapy Treatment/Discharge Summary  Patient Details  Name: Valerie Villa MRN: [id number] Date of Birth: 1924-07-20 Referring Provider (PT): [place] SUMMARY  Visits from Start of Care: 19  Current functional level related to goals / functional outcomes:  PT Long Term Goals - 03/08/22 1047       PT LONG TERM GOAL #1   Title Pt will verbalize plans for continued community fitness upon d/c from PT.   TARGET 03/12/2022    Time 7    Period Weeks    Status Achieved      PT LONG TERM GOAL #2   Title Pt will improve Berg score to at least 48/56 to decrease fall risk.    Baseline 43/56>45/56 01/26/2022> 48/56 03/08/2022    Time 7    Period Weeks    Status Achieved      PT LONG TERM GOAL #3   Title Patient will demonsrtated improved TUG </= 15 seconds to reduce fall risk    Baseline 22.84 sec; 16.41 sec 01/26/2022; TUG 18.03 sec 03/08/2022    Time 7    Period Weeks    Status Partially Met      PT LONG TERM GOAL #4   Title Pt will improve Dynamic Gait Index to at least 16/24 for decreased fall risk.    Baseline 12/24> 15/24  03/08/2022    Time 7    Period Weeks    Status Partially Met            Pt has met 2 of 4 LTGs and has partially met 2 of 4 LTGs.   Remaining deficits: Balance, posture (both improving)   Education / Equipment: Educated in [employer] and reviewed options/plans for continued community fitness.   Patient agrees to discharge. Patient goals were partially met. Patient is being discharged due to being pleased with the current functional level.  Mady Haagensen, PT 03/08/22 4:45 PM Phone: [phone] Fax: [phone]   Encounter Date: 03/08/2022   PT End of Session - 03/08/22 1008     Visit Number 19    Number of Visits 22    Date for PT Re-Evaluation 12/75/17   recert, including  week of 01/25/2022   Authorization Type HTA    Progress Note Due on Visit 20    PT Start Time 1012    PT Stop Time 1056    PT Time Calculation (min) 44 min    Activity Tolerance Patient tolerated treatment well    Behavior During Therapy [facility] for tasks assessed/performed             Past Medical History:  Diagnosis Date   Colon polyps    Hearing impairment    Hiatal hernia    Hx of basal cell carcinoma    Hypercholesteremia    Hypertension    Mild aortic stenosis    Osteoporosis    Seasonal allergies     History reviewed. No pertinent surgical history.  There were no vitals filed for this visit.   Subjective Assessment - 03/08/22 1014     Subjective Had a good night's sleep last night-very deep sleep.    Patient Stated Goals Pt's goals for therapy are to improve posture, balance.    Currently in Pain? No/denies                [facility]  PT Assessment - 03/08/22 0001       Standardized Balance Assessment   Standardized Balance Assessment Berg Balance Test;Timed Up and Go Test;Dynamic Gait Index      Berg Balance Test   Sit to Stand Able to stand without using hands and stabilize independently    Standing Unsupported Able to stand safely 2 minutes    Sitting with Back Unsupported but Feet Supported on Floor or Stool Able to sit safely and securely 2 minutes    Stand to Sit Sits safely with minimal use of hands    Transfers Able to transfer safely, minor use of hands    Standing Unsupported with Eyes Closed Able to stand 10 seconds safely    Standing Unsupported with Feet Together Able to place feet together independently and stand 1 minute safely    From Standing, Reach Forward with Outstretched Arm Can reach forward >12 cm safely (5")   7"   From Standing Position, Pick up Object from Floor Able to pick up shoe safely and easily    From Standing Position, Turn to Look Behind Over each Shoulder Looks behind from both sides and weight shifts well    Turn 360  Degrees Able to turn 360 degrees safely but slowly    Standing Unsupported, Alternately Place Feet on Step/Stool Able to stand independently and complete 8 steps >20 seconds   22.41 sec   Standing Unsupported, One Foot in Front Able to plae foot ahead of the other independently and hold 30 seconds    Standing on One Leg Tries to lift leg/unable to hold 3 seconds but remains standing independently    Total Score 48    Berg comment: Improved from 43/56      Dynamic Gait Index   Level Surface Mild Impairment    Change in Gait Speed Mild Impairment    Gait with Horizontal Head Turns Mild Impairment    Gait with Vertical Head Turns Mild Impairment    Gait and Pivot Turn Mild Impairment    Step Over Obstacle Moderate Impairment    Step Around Obstacles Mild Impairment    Steps Mild Impairment    Total Score 15    DGI comment: improved from 12/24      Timed Up and Go Test   Normal TUG (seconds) 18.06   18.53, 21.65   TUG Comments improved from 22.84 sec                                 Self Care:   PT Education - 03/08/22 1051     Education Details Progress towards PT goals, POC.  Discussed continued community fitness, including appropriate classes and supervision of caregiver for these classes.  Discussed plan for d/c this visit and pt/caregiver in agreement.    Person(s) Educated Patient;Caregiver(s)    Methods Explanation    Comprehension Verbalized understanding              PT Short Term Goals - 02/17/22 1410       PT SHORT TERM GOAL #1   Title Pt will perform progression of HEP with family supervision for improved strength, balance, posture, and gait.  TARGET 02/12/2022    Baseline met per caregiver report    Time 3    Period Weeks    Status Achieved               PT Long Term Goals -  03/08/22 1047       PT LONG TERM GOAL #1   Title Pt will verbalize plans for continued community fitness upon d/c from PT.   TARGET 03/12/2022    Time 7     Period Weeks    Status Achieved      PT LONG TERM GOAL #2   Title Pt will improve Berg score to at least 48/56 to decrease fall risk.    Baseline 43/56>45/56 01/26/2022> 48/56 03/08/2022    Time 7    Period Weeks    Status Achieved      PT LONG TERM GOAL #3   Title Patient will demonsrtated improved TUG </= 15 seconds to reduce fall risk    Baseline 22.84 sec; 16.41 sec 01/26/2022; TUG 18.03 sec 03/08/2022    Time 7    Period Weeks    Status Partially met     PT LONG TERM GOAL #4   Title Pt will improve Dynamic Gait Index to at least 16/24 for decreased fall risk.    Baseline 12/24> 15/24  03/08/2022    Time 7    Period Weeks    Status Partially Met                   Plan - 03/08/22 1640     Clinical Impression Statement Assessed LTGs this visit, with pt meeting 2 of 4 LTGs.  Pt met LTG 1 for community fitness plans (return to ex classes with caregiver), LTG 2 for improved Berg score (to 48/56).  LTG 3 and 4 partially met, with pt improving on TUG and DGI scores, just not to goal level.  Overall, pt's posture appears much straighter during PT sessions, her balance has improved and she is appropriate for d/c at this time with family/caregiver supervision, due to continued risk of falls.    Personal Factors and Comorbidities Comorbidity 3+;Time since onset of injury/illness/exacerbation   5 week onset of balance difficulty, postural changes with increased left lateral lean.   Comorbidities See problem list    Examination-Activity Limitations Transfers;Locomotion Level;Stand    Examination-Participation Restrictions Community Activity;Shop    Stability/Clinical Decision Making Evolving/Moderate complexity    Rehab Potential Good    PT Frequency 2x / week    PT Duration Other (comment)   7 weeks, including week of 01/25/22   PT Treatment/Interventions ADLs/Self Care Home Management;DME Instruction;Neuromuscular re-education;Balance training;Therapeutic exercise;Therapeutic  activities;Functional mobility training;Gait training;Stair training;Patient/family education;Passive range of motion;Manual techniques    PT Next Visit Plan Discharge this visit    Consulted and Agree with Plan of Care Patient;Family member/caregiver    Family Member [employer], caregiver             Patient will benefit from skilled therapeutic intervention in order to improve the following deficits and impairments:  Abnormal gait, Difficulty walking, Decreased balance, Decreased mobility, Decreased strength, Postural dysfunction  Visit Diagnosis: Unsteadiness on feet  Muscle weakness (generalized)  Other abnormalities of gait and mobility  Abnormal posture     Problem List Patient Active Problem List   Diagnosis Date Noted   Nonrheumatic aortic valve stenosis 05/30/2017   COPD, mild ([place]) 06/05/2014    Elanore Talcott W., PT 03/08/2022, 4:43 PM  [facility] [address] [place], [place], [address] Phone: [phone]   Fax:  [phone]  Name: NERIAH BROTT MRN: [id number] Date of Birth: 04-25-1924

## 2022-03-12 ENCOUNTER — Ambulatory Visit: Payer: PPO | Admitting: Physical Therapy

## 2022-04-07 DIAGNOSIS — H903 Sensorineural hearing loss, bilateral: Secondary | ICD-10-CM | POA: Diagnosis not present

## 2022-04-22 ENCOUNTER — Telehealth: Payer: Self-pay | Admitting: Family Medicine

## 2022-04-22 NOTE — Telephone Encounter (Signed)
Phone call placed to patient to offer to schedule a visit with [facility]. Phone rang, with no answer I left a voicemail for call back. 

## 2022-04-26 DIAGNOSIS — M1611 Unilateral primary osteoarthritis, right hip: Secondary | ICD-10-CM | POA: Diagnosis not present

## 2022-04-26 DIAGNOSIS — M79651 Pain in right thigh: Secondary | ICD-10-CM | POA: Diagnosis not present

## 2022-04-26 DIAGNOSIS — R350 Frequency of micturition: Secondary | ICD-10-CM | POA: Diagnosis not present

## 2022-04-27 ENCOUNTER — Telehealth: Payer: Self-pay

## 2022-04-27 NOTE — Telephone Encounter (Signed)
Attempted to contact patient to schedule a Palliative Care consult appointment. No answer left a message to return call.  

## 2022-04-28 ENCOUNTER — Telehealth: Payer: Self-pay

## 2022-04-28 NOTE — Telephone Encounter (Signed)
Spoke with patient's PCG Milina Pagett and scheduled a telephonic Palliative Consult for 05/03/22 @ 1 PM.  Consent obtained; updated [employer], Team List and Epic.

## 2022-05-03 ENCOUNTER — Encounter: Payer: Self-pay | Admitting: Family Medicine

## 2022-05-03 ENCOUNTER — Other Ambulatory Visit: Payer: PPO | Admitting: Family Medicine

## 2022-05-03 DIAGNOSIS — Z515 Encounter for palliative care: Secondary | ICD-10-CM | POA: Insufficient documentation

## 2022-05-03 NOTE — Progress Notes (Signed)
[facility] Palliative Care Consult Note Telephone: [phone]  Fax: [phone]   Date of encounter: 05/03/22 1:06 PM PATIENT NAME: Valerie Villa Valerie Villa [id number]   [phone] (home)  DOB: Apr 04, 1924 MRN: [id number] PRIMARY CARE PROVIDER:    Lajean Manes, MD,  [address] [address] [phone]  REFERRING PROVIDER:   Lajean Manes, MD [address] [place] [address] [place],   [address] [phone]  RESPONSIBLE PARTY:    Contact Information     Name Relation Home Work [place] Other   [phone]   [facility] Daughter   [phone]   Valerie, Villa [phone]     Lochra,Katherine Daughter   [phone]   Valerie, Villa   [phone]      I connected with  Valerie Villa and her caregiver Gayla Benn on 05/03/22 by phone and verified that I am speaking with the correct person using two identifiers.   I discussed the limitations of evaluation and management by telemedicine. The patient expressed understanding and agreed to proceed.  Palliative Care was asked to follow this patient by consultation request of  Lajean Manes, MD to address advance care planning and complex medical decision making. This is the initial visit.          ASSESSMENT, SYMPTOM MANAGEMENT AND PLAN / RECOMMENDATIONS:   Palliative Care Encounter DNR Discussed goals of care Encouraged to continue with moving to maintain mobility Increase Protein intake.    Follow up Palliative Care Visit: Palliative care will continue to follow for complex medical decision making, advance care planning, and clarification of goals. Return 3-4 weeks or prn.    This visit was coded based on medical decision making (MDM).  PPS: 70%  HOSPICE ELIGIBILITY/DIAGNOSIS: TBD  Chief Complaint:  [facility] received a referral to follow up with patient for chronic disease management  for non-rheumatic aortic stenosis and COPD.  Palliative Care is also following to help with advance directive  planning and defining/refining goals of care.  HISTORY OF PRESENT ILLNESS:  Valerie Villa is a 88 y.o. year old female with non-rheumatic aortic stenosis and mild COPD.  She was accompanied by Merrilee Seashore, caregiver who defers to her daughter and sons for goals of care discussion. She has a daughter, Valerie Villa and 2 sons. She has 24 hour companionship.  Pt is very independent, uses nothing for mobility.  Denies CP, SOB, falls, nausea, vomiting, dysuria or constipation. Just completed a round of antibiotics for a UTI. Continent of bowel and bladder.  Independent bathing and dressing.  She said she is in a good mood most of the time. PCP also has diagnosis of CKD stage 3A, Dementia, GERD and PVD.    History obtained from review of EMR, discussion with  paid caregiver Merrilee Seashore and/or Ms. Brazier.   CBC/CMP, TSH and Vitamin B 12 were unremarkable at PCP office on 04/07/21   I reviewed available labs, medications, imaging, studies and related documents from the EMR.  Records reviewed and summarized above.   ROS General: NAD ENMT: denies dysphagia Cardiovascular: denies chest pain, denies DOE above baseline Pulmonary: denies cough, denies increased SOB Abdomen: endorses good appetite, denies constipation, endorses continence of bowel GU: denies dysuria, endorses continence of urine MSK:  denies increased weakness, no falls reported Skin: denies rashes or wounds Neurological: denies pain, denies insomnia Psych: Endorses positive mood Heme/lymph/immuno: denies bruises, abnormal bleeding  Physical Exam: Current and past  weights: On 04/26/22 PCP has weight listed as 81 lbs down from 89 at the beginning of the year. Limited to audible only ENMT: intact hearing with hearing aids, soft voice CV: Able to speak in complete sentences without having to stop to breathe Pulmonary: No audible wheezing, no  cough, room air Neuro:  no noted cognitive impairment Psych: non-anxious affect, A and O x 3  CURRENT PROBLEM LIST:  Patient Active Problem List   Diagnosis Date Noted   Nonrheumatic aortic valve stenosis 05/30/2017   COPD, mild ([place]) 06/05/2014   PAST MEDICAL HISTORY:  Active Ambulatory Problems    Diagnosis Date Noted   COPD, mild ([place]) 06/05/2014   Nonrheumatic aortic valve stenosis 05/30/2017   Resolved Ambulatory Problems    Diagnosis Date Noted   No Resolved Ambulatory Problems   Past Medical History:  Diagnosis Date   Colon polyps    Hearing impairment    Hiatal hernia    Hx of basal cell carcinoma    Hypercholesteremia    Hypertension    Mild aortic stenosis    Osteoporosis    Seasonal allergies    SOCIAL HX:  Social History   Tobacco Use   Smoking status: Former    Packs/day: 0.10    Years: 5.00    Total pack years: 0.50    Types: Cigarettes    Quit date: 06/05/1964    Years since quitting: 57.9   Smokeless tobacco: Never   Tobacco comments:    "social smoker"  Substance Use Topics   Alcohol use: No   FAMILY HX:  Family History  Problem Relation Age of Onset   Heart disease Mother    Heart disease Father    Cancer Sister        throat       Preferred Pharmacy: ALLERGIES:  Allergies  Allergen Reactions   Actonel [Risedronate Sodium]    Albuterol    Altace [Ramipril]    Atropine    Biaxin [Clarithromycin]    Ciprofloxacin    Clindamycin/Lincomycin    Delsym [Dextromethorphan] Nausea Only   Fosamax [Alendronate Sodium]    Hawthorn [Crataegus Oxyacantha]    Hctz [Hydrochlorothiazide]    Listerine    Nasacort [Triamcinolone]    Neosporin [Neomycin-Bacitracin Zn-Polymyx]    Polysporin [Bacitracin-Polymyxin B]    Rhinocort [Budesonide]    Scope [Antiseptic Mouth Rinse]    Simvastatin    Tetracyclines & Related    Vantin [Cefpodoxime]    Vioxx [Rofecoxib]    Xyntha [Antihem Factor Recomb (Rfviii)]      PERTINENT MEDICATIONS:   Outpatient Encounter Medications as of 05/03/2022  Medication Sig   cholecalciferol (VITAMIN D) 1000 UNITS tablet Take 1,000 Units by mouth daily.   COVID-19 mRNA bivalent vaccine, Moderna, (MODERNA COVID-19 BIVAL BOOSTER) 50 MCG/0.5ML injection Inject into the muscle.   COVID-19 mRNA vaccine, Moderna, 100 MCG/0.5ML injection Inject into the muscle.   cyanocobalamin 100 MCG tablet Take 100 mcg by mouth daily. (Patient not taking: Reported on 12/04/2021)   fexofenadine (ALLEGRA) 60 MG tablet Take 60 mg by mouth 2 (two) times daily. (Patient not taking: Reported on 12/04/2021)   Misc Natural Products (TART CHERRY ADVANCED) CAPS Take 1,200 capsules by mouth 3 (three) times daily.   No facility-administered encounter medications on file as of 05/03/2022.     --------------------------------------------------------------- Advance Care Planning/Goals of Care: Goals include to maximize quality of life and symptom management. Patient gave her permission to discuss.Our advance care planning conversation included a discussion about:  The value and importance of advance care planning  Exploration of goals of care in the event of a sudden injury or illness  Identification  of a healthcare agent-has a daughter Valerie Villa and 2 sons Review of an advance directive document -MOST which will be discussed with pt and daughter at follow up visit. Decision not to resuscitate or to de-escalate disease focused treatments due to poor prognosis. CODE STATUS: Has DNR    Thank you for the opportunity to participate in the care of Ms. Hurta.  The palliative care team will continue to follow. Please call our office at [phone] if we can be of additional assistance.   Marijo Conception, FNP-C  COVID-19 PATIENT SCREENING TOOL Asked and negative response unless otherwise noted:  Have you had symptoms of covid, tested positive or been in contact with someone with symptoms/positive test in the past 5-10 days? no

## 2022-05-19 DIAGNOSIS — R21 Rash and other nonspecific skin eruption: Secondary | ICD-10-CM | POA: Diagnosis not present

## 2022-05-19 DIAGNOSIS — R300 Dysuria: Secondary | ICD-10-CM | POA: Diagnosis not present

## 2022-05-24 ENCOUNTER — Ambulatory Visit: Payer: PPO | Attending: Geriatric Medicine | Admitting: Physical Therapy

## 2022-05-24 DIAGNOSIS — M25551 Pain in right hip: Secondary | ICD-10-CM | POA: Diagnosis not present

## 2022-05-24 DIAGNOSIS — R2681 Unsteadiness on feet: Secondary | ICD-10-CM | POA: Insufficient documentation

## 2022-05-24 DIAGNOSIS — M6281 Muscle weakness (generalized): Secondary | ICD-10-CM | POA: Insufficient documentation

## 2022-05-24 DIAGNOSIS — R2689 Other abnormalities of gait and mobility: Secondary | ICD-10-CM | POA: Insufficient documentation

## 2022-05-24 NOTE — Therapy (Signed)
OUTPATIENT PHYSICAL THERAPY NEURO EVALUATION   Patient Name: Valerie Villa MRN: [id number] DOB:23-Mar-1924, 88 y.o., female Today's Date: 05/25/2022   PCP: Lajean Manes, MD,   REFERRING PROVIDER: Inez Catalina, MD    PT End of Session - 05/25/22 1906     Visit Number 1    Number of Visits 5    Date for PT Re-Evaluation 06/25/22    Authorization Type HTA    Authorization Time Period 05-24-22 - 07-24-22    PT Start Time 1018    PT Stop Time 1100    PT Time Calculation (min) 42 min    Activity Tolerance Patient tolerated treatment well    Behavior During Therapy [facility] for tasks assessed/performed             Past Medical History:  Diagnosis Date   Colon polyps    Hearing impairment    Hiatal hernia    Hx of basal cell carcinoma    Hypercholesteremia    Hypertension    Mild aortic stenosis    Osteoporosis    Seasonal allergies    History reviewed. No pertinent surgical history. Patient Active Problem List   Diagnosis Date Noted   Palliative care encounter 05/03/2022   Nonrheumatic aortic valve stenosis 05/30/2017   COPD, mild ([place]) 06/05/2014    ONSET DATE: April 19, 2022  REFERRING DIAG: M16.11 (ICD-10-CM) - Unilateral primary osteoarthritis, right hip   THERAPY DIAG:  Pain in right hip  Muscle weakness (generalized)  Unsteadiness on feet  Other abnormalities of gait and mobility  Rationale for Evaluation and Treatment Rehabilitation  SUBJECTIVE:                                                                                                                                                                                              SUBJECTIVE STATEMENT: Pt presents to PT eval accompanied by her caregiver who reports pt strained her Rt quad muscle in a balance class at [facility] in mid July 2023:  was diagnosed with Rt hip bursitis and OA Pt accompanied by:  caregiver, [name]  PERTINENT HISTORY: CKD stage 3A, Dementia, GERD and PVD, non-rheumatic aortic  stenosis and mild COPD.     PAIN:  Are you having pain?  Intermittent pain; car transfer hurts and sometimes sit to stand transfers hurt  PRECAUTIONS: Fall  WEIGHT BEARING RESTRICTIONS No  FALLS: Has patient fallen in last 6 months? No  LIVING ENVIRONMENT: Lives with: lives with an adult companion Lives in: House/apartment Stairs: Yes: External: 4 steps; can reach both in garage Has following equipment at home: Single point cane and Walker -  2 wheeled  PLOF: Independent with basic ADLs, Independent with household mobility without device, Independent with transfers, and Needs assistance with homemaking  PATIENT GOALS Resume balance class at [facility]; decrease pain in Rt hip, increase leg strength  OBJECTIVE:    COGNITION: Overall cognitive status: Impaired   SENSATION: WFL  COORDINATION: WNL's  MUSCLE TONE: RLE: Within functional limits    POSTURE: rounded shoulders, forward head, and decreased thoracic kyphosis  LOWER EXTREMITY ROM:   WFL's  LOWER EXTREMITY MMT:    MMT Right Eval Left Eval  Hip flexion 4- 5  Hip extension    Hip abduction    Hip adduction    Hip internal rotation    Hip external rotation    Knee flexion    Knee extension 4 5  Ankle dorsiflexion    Ankle plantarflexion    Ankle inversion    Ankle eversion    (Blank rows = not tested)  BED MOBILITY:  Sit to supine Modified independence Supine to sit Modified independence  TRANSFERS: Assistive device utilized: RUE support needed  Sit to stand: Modified independence Stand to sit: Modified independence   GAIT: Gait pattern: step through pattern Distance walked: 100 Assistive device utilized: None Level of assistance: SBA Gait velocity:  14.32 secs = 2.29 ft/sec  FUNCTIONAL TESTs:  5 times sit to stand: 33.07 secs with RUE support Timed up and go (TUG): 14.82 secs     PATIENT EDUCATION: Education details: gave pt & caregiver info on aquatic therapy Person educated:  Patient and Caregiver   Education method: Explanation Education comprehension: verbalized understanding   HOME EXERCISE PROGRAM: To be established     GOALS: Goals reviewed with patient? Yes   LONG TERM GOALS: Target date: 06/29/2022   1.  Pt will perform aquatic exercise program with caregiver's assistance.  Baseline:  Goal status: INITIAL  2.  Pt will increase endurance so that she is able to participate in 45" aquatic therapy session with </= min. C/o fatigue. Baseline:  Goal status: INITIAL  3.  Pt will report at least 50% improvement in Rt hip pain after completion of aquatic therapy sessions.  Baseline:  Goal status: INITIAL   ASSESSMENT:  CLINICAL IMPRESSION: Patient is a 88 y.o. lady who was seen today for physical therapy evaluation and treatment for Rt hip primary OA. Pt evaluated for aquatic therapy only per referral;  pt is at mild fall risk per TUG score of 14.82 secs without use of device.  Pt reports intermittent pain in Rt hip, particularly with sit to stand transfers and with car transfers.  Pt is currently ambulating without device.  Pt will benefit from aquatic therapy for strengthening with less stress on joints.     OBJECTIVE IMPAIRMENTS decreased endurance, difficulty walking, decreased strength, and pain.   ACTIVITY LIMITATIONS bending, squatting, transfers, and locomotion level  PARTICIPATION LIMITATIONS: meal prep, laundry, shopping, and community activity  PERSONAL FACTORS Age and Past/current experiences are also affecting patient's functional outcome.   REHAB POTENTIAL: Good  CLINICAL DECISION MAKING: Stable/uncomplicated  EVALUATION COMPLEXITY: Low  PLAN: PT FREQUENCY: 1x/week  PT DURATION: 4 weeks  PLANNED INTERVENTIONS: Patient/Family education, Self Care, and Aquatic Therapy  PLAN FOR NEXT SESSION: begin aquatic therapy - if pt so chooses this program   Keiaira, Donlan, PT 05/25/2022, 7:39 PM

## 2022-05-24 NOTE — Therapy (Deleted)
OUTPATIENT PHYSICAL THERAPY NEURO EVALUATION   Patient Name: Valerie Villa MRN: [id number] DOB:Jun 11, 1924, 88 y.o., female Today's Date: 05/24/2022   PCP: Lajean Manes, MD REFERRING PROVIDER: Inez Catalina, MD    Past Medical History:  Diagnosis Date   Colon polyps    Hearing impairment    Hiatal hernia    Hx of basal cell carcinoma    Hypercholesteremia    Hypertension    Mild aortic stenosis    Osteoporosis    Seasonal allergies    No past surgical history on file. Patient Active Problem List   Diagnosis Date Noted   Palliative care encounter 05/03/2022   Nonrheumatic aortic valve stenosis 05/30/2017   COPD, mild ([place]) 06/05/2014    ONSET DATE: 04/27/22 (Referral Date)  REFERRING DIAG: M16.11 (ICD-10-CM) - Unilateral primary osteoarthritis, right hip (Noted orders for Aquatic Therapy)  THERAPY DIAG:  No diagnosis found.  Rationale for Evaluation and Treatment Rehabilitation  SUBJECTIVE:                                                                                                                                                                                              SUBJECTIVE STATEMENT: *** Pt accompanied by:  Caregiver  PERTINENT HISTORY: Hearing Impairment, Hx of Basal Cell Carcinoma, Hypercholesteremia, HTN, Mild Aortic Stenosis, Osteoporosis  PAIN:  Are you having pain? {OPRCPAIN:27236}  PRECAUTIONS: Fall  WEIGHT BEARING RESTRICTIONS No  FALLS: Has patient fallen in last 6 months? {fallsyesno:27318}  LIVING ENVIRONMENT: Lives with: {OPRC lives with:25569::"lives with their family"} Lives in: {Lives in:25570} Stairs: {opstairs:27293} Has following equipment at home: {Assistive devices:23999}  PLOF: {PLOF:24004}  PATIENT GOALS ***  OBJECTIVE:    SENSATION: {sensation:27233}  COORDINATION: ***  POSTURE: {posture:25561}   LOWER EXTREMITY ROM:     {AROM/PROM:27142}  Right Eval Left Eval  Hip flexion    Hip extension     Hip abduction    Hip adduction    Hip internal rotation    Hip external rotation    Knee flexion    Knee extension    Ankle dorsiflexion    Ankle plantarflexion    Ankle inversion    Ankle eversion     (Blank rows = not tested)  LOWER EXTREMITY MMT:    MMT Right Eval Left Eval  Hip flexion    Hip extension    Hip abduction    Hip adduction    Hip internal rotation    Hip external rotation    Knee flexion    Knee extension    Ankle dorsiflexion    Ankle plantarflexion    Ankle inversion  Ankle eversion    (Blank rows = not tested)   TRANSFERS: Assistive device utilized: {Assistive devices:23999}  Sit to stand: {Levels of assistance:24026} Stand to sit: {Levels of assistance:24026}  STAIRS:  Level of Assistance: {Levels of assistance:24026}  Stair Negotiation Technique: {Stair Technique:27161} with {Rail Assistance:27162}  Number of Stairs: ***   Height of Stairs: ***  Comments: ***  GAIT: Gait pattern: {gait characteristics:25376} Distance walked: *** Assistive device utilized: {Assistive devices:23999} Level of assistance: {Levels of assistance:24026} Comments: ***  FUNCTIONAL TESTs:  5 times sit to stand: *** Timed up and go (TUG): ***  PATIENT SURVEYS:  N/A   PATIENT EDUCATION: Education details: *** Person educated: {Person educated:25204} Education method: {Education Method:25205} Education comprehension: {Education Comprehension:25206}   HOME EXERCISE PROGRAM: To Be Established    GOALS: Goals reviewed with patient? {yes/no:20286}  SHORT TERM GOALS: Target date: {follow up:25551}  *** Baseline: Goal status: {GOALSTATUS:25110}  2.  *** Baseline:  Goal status: {GOALSTATUS:25110}  3.  *** Baseline:  Goal status: {GOALSTATUS:25110}  4.  *** Baseline:  Goal status: {GOALSTATUS:25110}  5.  *** Baseline:  Goal status: {GOALSTATUS:25110}  6.  *** Baseline:  Goal status: {GOALSTATUS:25110}  LONG TERM GOALS: Target  date: {follow up:25551}  *** Baseline:  Goal status: {GOALSTATUS:25110}  2.  *** Baseline:  Goal status: {GOALSTATUS:25110}  3.  *** Baseline:  Goal status: {GOALSTATUS:25110}  4.  *** Baseline:  Goal status: {GOALSTATUS:25110}  5.  *** Baseline:  Goal status: {GOALSTATUS:25110}  6.  *** Baseline:  Goal status: {GOALSTATUS:25110}  ASSESSMENT:  CLINICAL IMPRESSION: Patient is a 88 y.o. female referred to Neuro OPPT services for ***. Patient's PMH significant for the following: *** . Upon evaluation, patient presents with the following impairments: ***. Patient will benefit from skilled PT services to address impairments.     OBJECTIVE IMPAIRMENTS {opptimpairments:25111}.   ACTIVITY LIMITATIONS {activitylimitations:27494}  PARTICIPATION LIMITATIONS: {participationrestrictions:25113}  PERSONAL FACTORS {Personal factors:25162} are also affecting patient's functional outcome.   REHAB POTENTIAL: {rehabpotential:25112}  CLINICAL DECISION MAKING: {clinical decision making:25114}  EVALUATION COMPLEXITY: {Evaluation complexity:25115}  PLAN: PT FREQUENCY: {rehab frequency:25116}  PT DURATION: {rehab duration:25117}  PLANNED INTERVENTIONS: {rehab planned interventions:25118::"Therapeutic exercises","Therapeutic activity","Neuromuscular re-education","Balance training","Gait training","Patient/Family education","Self Care","Joint mobilization"}  PLAN FOR NEXT SESSION: ***   Kirtland Bouchard, PT, DPT 05/24/2022, 8:14 AM

## 2022-05-25 ENCOUNTER — Encounter: Payer: Self-pay | Admitting: Physical Therapy

## 2022-05-26 ENCOUNTER — Encounter: Payer: Self-pay | Admitting: Family Medicine

## 2022-05-26 ENCOUNTER — Other Ambulatory Visit: Payer: PPO | Admitting: Family Medicine

## 2022-05-26 VITALS — BP 118/68 | HR 72 | Resp 16

## 2022-05-26 DIAGNOSIS — Z515 Encounter for palliative care: Secondary | ICD-10-CM

## 2022-05-26 NOTE — Progress Notes (Unsigned)
[facility] Palliative Care Consult Note Telephone: [phone]  Fax: [phone]    Date of encounter: 05/26/22 1:04 PM PATIENT NAME: Valerie Villa [place] [address]   [phone] (home)  DOB: January 02, 1924 MRN: [id number] PRIMARY CARE PROVIDER:    Lajean Manes, MD,  [address] [place] [address] [place] [address] [phone]  REFERRING PROVIDER:   Lajean Manes, MD [address] [place] [address] [place],  [place] [address] [phone]  RESPONSIBLE PARTY:    Contact Information     Name Relation Home Work Mobile   [facility] Other   [phone]   [facility] Daughter   [phone]   Tajha, Sammarco [phone]     Lochra,Katherine Daughter   [phone]   Ludie, Hudon   [phone]        I met face to face with patient and family in *** home/facility. Palliative Care was asked to follow this patient by consultation request of  Lajean Manes, MD to address advance care planning and complex medical decision making. This is a follow up visit.                                   ASSESSMENT AND PLAN / RECOMMENDATIONS:   Advance Care Planning/Goals of Care: Goals include to maximize quality of life and symptom management. Patient/health care surrogate gave his/her permission to discuss. Our advance care planning conversation included a discussion about:    The value and importance of advance care planning  Experiences with loved ones who have been seriously ill or have died  Exploration of personal, cultural or spiritual beliefs that might influence medical decisions  Exploration of goals of care in the event of a sudden injury or illness  Identification of a healthcare agent  Review and updating or creation of an  advance directive document . Decision not to resuscitate or to de-escalate disease focused treatments due to poor prognosis. CODE STATUS:  Symptom Management/Plan:    Follow up Palliative  Care Visit: Palliative care will continue to follow for complex medical decision making, advance care planning, and clarification of goals. Return *** weeks or prn.  I spent *** minutes providing this consultation. More than 50% of the time in this consultation was spent in counseling and care coordination.  This visit was coded based on medical decision making (MDM).***  PPS: ***0%  HOSPICE ELIGIBILITY/DIAGNOSIS: TBD  Chief Complaint: ***  HISTORY OF PRESENT ILLNESS:  Valerie Villa is a 88 y.o. year old female  with *** .  Dr Windy Canny is new PCP.  Has had a recent UTI treated with Keflex.  She plans to start pool therapy next week. No skin breakdown. She prescribed a Triamcinolone steroid cream for the left leg "blood blisters".  No problems currently with balance and no falls. Has good appetite.  Likes to drink milk.  Coughs in the fall but not routinely.  No SOB. Independent with bathing and dressing.    History obtained from review of EMR, discussion with primary team, and interview with family, facility staff/caregiver and/or Ms. Jeschke.    I reviewed available labs, medications, imaging, studies and related documents from the EMR.  Records reviewed and summarized above.   ROS General: NAD ENMT: denies dysphagia Cardiovascular: denies chest pain, denies DOE Pulmonary: denies cough, denies increased SOB Abdomen: endorses good appetite, denies constipation, endorses continence of bowel GU: denies dysuria, endorses continence  of urine MSK:  denies increased weakness, no falls reported Skin: denies rashes or wounds Neurological: denies pain, denies insomnia Psych: Endorses positive mood Heme/lymph/immuno: denies bruises, abnormal bleeding  Physical Exam: Current and past weights: 84.5 lbs Constitutional: NAD General: thin/WD  CV: S1S2, RRR, no LE edema Pulmonary: CTAB, no increased work of breathing, no cough, room air Abdomen: normo-active BS + 4 quadrants, soft and non  tender GU: deferred MSK: no sarcopenia, moves all extremities, ambulatory Skin: warm and dry, dry skin Neuro:  no generalized weakness,  no cognitive impairment Psych: non-anxious affect, A and O x 3    Thank you for the opportunity to participate in the care of [name].  The palliative care team will continue to follow. Please call our office at [phone] if we can be of additional assistance.   Marijo Conception, FNP   COVID-19 PATIENT SCREENING TOOL Asked and negative response unless otherwise noted:   Have you had symptoms of covid, tested positive or been in contact with someone with symptoms/positive test in the past 5-10 days?

## 2022-06-02 ENCOUNTER — Ambulatory Visit: Payer: PPO | Admitting: Physical Therapy

## 2022-06-08 DIAGNOSIS — Z85828 Personal history of other malignant neoplasm of skin: Secondary | ICD-10-CM | POA: Diagnosis not present

## 2022-06-08 DIAGNOSIS — I872 Venous insufficiency (chronic) (peripheral): Secondary | ICD-10-CM | POA: Diagnosis not present

## 2022-06-08 DIAGNOSIS — L821 Other seborrheic keratosis: Secondary | ICD-10-CM | POA: Diagnosis not present

## 2022-06-08 DIAGNOSIS — I8312 Varicose veins of left lower extremity with inflammation: Secondary | ICD-10-CM | POA: Diagnosis not present

## 2022-06-08 DIAGNOSIS — L308 Other specified dermatitis: Secondary | ICD-10-CM | POA: Diagnosis not present

## 2022-06-08 DIAGNOSIS — I8311 Varicose veins of right lower extremity with inflammation: Secondary | ICD-10-CM | POA: Diagnosis not present

## 2022-06-16 ENCOUNTER — Encounter: Payer: Self-pay | Admitting: Physical Therapy

## 2022-06-16 ENCOUNTER — Ambulatory Visit: Payer: PPO | Attending: Geriatric Medicine | Admitting: Physical Therapy

## 2022-06-16 DIAGNOSIS — R2689 Other abnormalities of gait and mobility: Secondary | ICD-10-CM | POA: Diagnosis not present

## 2022-06-16 DIAGNOSIS — R2681 Unsteadiness on feet: Secondary | ICD-10-CM | POA: Diagnosis not present

## 2022-06-16 DIAGNOSIS — M6281 Muscle weakness (generalized): Secondary | ICD-10-CM | POA: Insufficient documentation

## 2022-06-16 NOTE — Therapy (Signed)
OUTPATIENT PHYSICAL THERAPY NEURO TREATMENT NOTE   Patient Name: Valerie Villa MRN: [id number] DOB:[birth year], 88 y.o., female Today's Date: 06/16/2022   PCP: Lajean Manes, MD,   REFERRING PROVIDER: Inez Catalina, MD    PT End of Session - 06/16/22 1755     Visit Number 2    Number of Visits 5    Date for PT Re-Evaluation 06/25/22    Authorization Type HTA    Authorization Time Period 05-24-22 - 07-24-22    PT Start Time 1315    PT Stop Time 1355    PT Time Calculation (min) 40 min    Activity Tolerance Patient tolerated treatment well    Behavior During Therapy [facility] for tasks assessed/performed             Past Medical History:  Diagnosis Date   Colon polyps    Hearing impairment    Hiatal hernia    Hx of basal cell carcinoma    Hypercholesteremia    Hypertension    Mild aortic stenosis    Osteoporosis    Seasonal allergies    History reviewed. No pertinent surgical history. Patient Active Problem List   Diagnosis Date Noted   Palliative care encounter 05/03/2022   Nonrheumatic aortic valve stenosis 05/30/2017   COPD, mild ([place]) 06/05/2014    ONSET DATE: April 19, 2022  REFERRING DIAG: M16.11 (ICD-10-CM) - Unilateral primary osteoarthritis, right hip   THERAPY DIAG:  Muscle weakness (generalized)  Unsteadiness on feet  Other abnormalities of gait and mobility  Rationale for Evaluation and Treatment Rehabilitation  SUBJECTIVE:                                                                                                                                                                                              SUBJECTIVE STATEMENT: Pt presents to first aquatic PT session at [facility] - accompanied by her caregiver, Anne Pt accompanied by:  caregiver, Energy manager  PERTINENT HISTORY: CKD stage 3A, Dementia, GERD and PVD, non-rheumatic aortic stenosis and mild COPD.     PAIN:  Are you having pain? No  PRECAUTIONS: Fall  WEIGHT BEARING RESTRICTIONS  No  FALLS: Has patient fallen in last 6 months? No  LIVING ENVIRONMENT: Lives with: lives with an adult companion Lives in: House/apartment Stairs: Yes: External: 4 steps; can reach both in garage Has following equipment at home: Single point cane and Walker - 2 wheeled  PLOF: Independent with basic ADLs, Independent with household mobility without device, Independent with transfers, and Needs assistance with homemaking  PATIENT GOALS Resume balance class at [place]; decrease pain in Rt hip, increase leg  strength  OBJECTIVE:  Aquatic therapy at [facility] - pool temp 90 degrees  Patient seen for aquatic therapy today.  Treatment took place in water 3.5-4.5 feet deep depending upon activity.  Pt entered and exited the pool via step negotiation with use of hand rails with CGA using step by step sequence.   Pt performed water walking forward 18' x 4 reps, sideways 18' x 4 reps, backwards 18' x 2 reps with UE support on large yellow noodle with CGA to min assist  Marching in place 10 reps with UE support on yellow noodle; marching across pool (18') with UE support on noodle x 2 reps  Small squats 10 reps x 2 sets - bil. UE support on pool edge Heel raises 10 reps 2 sets with UE support on pool edge  Pt performed standing hip flexion, abduction and extension 10 reps each leg holding onto pool edge for support and assist with balance  Pt stood on LLE - held RLE in front and made 5 small circles clockwise for improved SLS on LLE and for increased Rt hip AROM - pt reported fatigue after this exercise which was performed at end of session, 40' total treatment time    PATIENT EDUCATION: Education details: gave pt & caregiver info on aquatic therapy Person educated: Patient and Caregiver   Education method: Explanation Education comprehension: verbalized understanding   HOME EXERCISE PROGRAM: To be established     GOALS: Goals reviewed with patient? Yes   LONG TERM GOALS: Target  date: 06/29/2022   1.  Pt will perform aquatic exercise program with caregiver's assistance.  Baseline:  Goal status: INITIAL  2.  Pt will increase endurance so that she is able to participate in 45" aquatic therapy session with </= min. C/o fatigue. Baseline:  Goal status: INITIAL  3.  Pt will report at least 50% improvement in Rt hip pain after completion of aquatic therapy sessions.  Baseline:  Goal status: INITIAL   ASSESSMENT:  CLINICAL IMPRESSION: Aquatic therapy session focused on water walking for strengthening and balance training and AROM hip exercises using bouyancy resisted exercises for resistance for strengthening.  Pt used large yellow noodle for UE support for assist with balance with CGA to min assist given as needed for stability and for recovery.  Pt tolerated exercises well with no rest breaks needed during session - pt reported fatigue after 40" participation in session.  Cont with POC.     OBJECTIVE IMPAIRMENTS decreased endurance, difficulty walking, decreased strength, and pain.   ACTIVITY LIMITATIONS bending, squatting, transfers, and locomotion level  PARTICIPATION LIMITATIONS: meal prep, laundry, shopping, and community activity  PERSONAL FACTORS Age and Past/current experiences are also affecting patient's functional outcome.   REHAB POTENTIAL: Good  CLINICAL DECISION MAKING: Stable/uncomplicated  EVALUATION COMPLEXITY: Low  PLAN: PT FREQUENCY: 1x/week  PT DURATION: 4 weeks  PLANNED INTERVENTIONS: Patient/Family education, Self Care, and Aquatic Therapy  PLAN FOR NEXT SESSION: cont with balance & gait training in pool, strengthening exs for bil. LE's   Chantel, Teti, PT 06/16/2022, 5:57 PM

## 2022-06-23 ENCOUNTER — Encounter: Payer: Self-pay | Admitting: Physical Therapy

## 2022-06-23 ENCOUNTER — Ambulatory Visit: Payer: PPO | Admitting: Physical Therapy

## 2022-06-23 DIAGNOSIS — M6281 Muscle weakness (generalized): Secondary | ICD-10-CM

## 2022-06-23 DIAGNOSIS — R2681 Unsteadiness on feet: Secondary | ICD-10-CM

## 2022-06-23 NOTE — Therapy (Signed)
OUTPATIENT PHYSICAL THERAPY NEURO TREATMENT NOTE   Patient Name: Valerie Villa MRN: [id number] DOB:1924-01-07, 88 y.o., female Today's Date: 06/23/2022   PCP: Lajean Manes, MD,   REFERRING PROVIDER: Inez Catalina, MD    PT End of Session - 06/23/22 1707     Visit Number 3    Number of Visits 5    Date for PT Re-Evaluation 06/25/22    Authorization Type HTA    Authorization Time Period 05-24-22 - 07-24-22    PT Start Time 1300    PT Stop Time 1340    PT Time Calculation (min) 40 min    Equipment Utilized During Treatment Other (comment)   pool noodle   Activity Tolerance Patient tolerated treatment well    Behavior During Therapy [facility] for tasks assessed/performed             Past Medical History:  Diagnosis Date   Colon polyps    Hearing impairment    Hiatal hernia    Hx of basal cell carcinoma    Hypercholesteremia    Hypertension    Mild aortic stenosis    Osteoporosis    Seasonal allergies    History reviewed. No pertinent surgical history. Patient Active Problem List   Diagnosis Date Noted   Palliative care encounter 05/03/2022   Nonrheumatic aortic valve stenosis 05/30/2017   COPD, mild ([place]) 06/05/2014    ONSET DATE: April 19, 2022  REFERRING DIAG: M16.11 (ICD-10-CM) - Unilateral primary osteoarthritis, right hip   THERAPY DIAG:  Muscle weakness (generalized)  Unsteadiness on feet  Rationale for Evaluation and Treatment Rehabilitation  SUBJECTIVE:                                                                                                                                                                                              SUBJECTIVE STATEMENT: Pt presents to aquatic PT session at [place] [facility] - accompanied by her caregiver, Dragoo, who reports pt was tired after initial aquatic therapy session last week Pt accompanied by:  caregiver, [name]  PERTINENT HISTORY: CKD stage 3A, Dementia, GERD and PVD, non-rheumatic aortic stenosis and mild  COPD.     PAIN:  Are you having pain? No  PRECAUTIONS: Fall  WEIGHT BEARING RESTRICTIONS No  FALLS: Has patient fallen in last 6 months? No  LIVING ENVIRONMENT: Lives with: lives with an adult companion Lives in: House/apartment Stairs: Yes: External: 4 steps; can reach both in garage Has following equipment at home: Single point cane and Walker - 2 wheeled  PLOF: Independent with basic ADLs, Independent with household mobility without device, Independent with transfers, and Needs assistance with  homemaking  PATIENT GOALS Resume balance class at [place]; decrease pain in Rt hip, increase leg strength  OBJECTIVE:  Aquatic therapy at [facility] - pool temp 90 degrees  Patient seen for aquatic therapy today.  Treatment took place in water 3.5-4.5 feet deep depending upon activity.  Pt entered and exited the pool via step negotiation with use of hand rails with CGA using step by step sequence.   Pt performed water walking forward 18' x 4 reps, sideways 18' x 4 reps, backwards 18' x 4 reps with UE support on large yellow noodle with CGA to min assist for stabilization of noodle for balance  Marching in place 10 reps with UE support on yellow noodle; marching across pool (18') with UE support on noodle x 2 reps  Small squats 10 reps  - bil. UE support on pool edge Heel raises 10 reps  with UE support on pool edge  Pt performed stepping strategy - forward, back and side stepping 5 reps each direction with each leg - holding onto noodle for stability (stabilized by PT)  Pt performed standing hip flexion, abduction and extension 10 reps each leg holding onto pool noodle, stabilized by PT, for support and assist with balance  Pt amb. Forward across width of pool (18') x 1 rep at end of session - holding onto noodle   PATIENT EDUCATION: Education details: gave pt & caregiver info on aquatic therapy Person educated: Patient and Caregiver   Education method: Explanation Education  comprehension: verbalized understanding   HOME EXERCISE PROGRAM: To be established     GOALS: Goals reviewed with patient? Yes   LONG TERM GOALS: Target date: 06/29/2022; EXTEND target date to 07-12-22 due to only 2 sessions attended to date (06-23-22)   1.  Pt will perform aquatic exercise program with caregiver's assistance.  Baseline:  Goal status: INITIAL  2.  Pt will increase endurance so that she is able to participate in 45" aquatic therapy session with </= min. C/o fatigue. Baseline:  Goal status: INITIAL  3.  Pt will report at least 50% improvement in Rt hip pain after completion of aquatic therapy sessions.  Baseline:  Goal status: INITIAL   ASSESSMENT:  CLINICAL IMPRESSION: Aquatic therapy session focused on LE strengthening using buoyancy of water only for resistance and on balance exercises.  Pt held onto large yellow noodle for stability (stabilized by PT) but did not hold edge of pool for any exercises in today's session, in order to challenge and facilitate static & dynamic standing balance.  Session was limited to 40" due to c/o fatigue after previous aquatic session last week, however, pt did not report fatigue during today's session.  Cont with POC.     OBJECTIVE IMPAIRMENTS decreased endurance, difficulty walking, decreased strength, and pain.   ACTIVITY LIMITATIONS bending, squatting, transfers, and locomotion level  PARTICIPATION LIMITATIONS: meal prep, laundry, shopping, and community activity  PERSONAL FACTORS Age and Past/current experiences are also affecting patient's functional outcome.   REHAB POTENTIAL: Good  CLINICAL DECISION MAKING: Stable/uncomplicated  EVALUATION COMPLEXITY: Low  PLAN: PT FREQUENCY: 1x/week  PT DURATION: 4 weeks  PLANNED INTERVENTIONS: Patient/Family education, Self Care, and Aquatic Therapy  PLAN FOR NEXT SESSION: cont with balance & gait training in pool, strengthening exs for bil. LE's   Seniya, [place],  PT 06/23/2022, 5:12 PM

## 2022-06-30 ENCOUNTER — Ambulatory Visit: Payer: Self-pay | Admitting: Physical Therapy

## 2022-06-30 ENCOUNTER — Encounter: Payer: Self-pay | Admitting: Physical Therapy

## 2022-06-30 DIAGNOSIS — M6281 Muscle weakness (generalized): Secondary | ICD-10-CM

## 2022-06-30 DIAGNOSIS — R2689 Other abnormalities of gait and mobility: Secondary | ICD-10-CM

## 2022-06-30 DIAGNOSIS — R2681 Unsteadiness on feet: Secondary | ICD-10-CM

## 2022-06-30 NOTE — Therapy (Signed)
OUTPATIENT PHYSICAL THERAPY NEURO TREATMENT NOTE   Patient Name: Valerie Villa MRN: [id number] DOB:1924/05/25, 88 y.o., female Today's Date: 06/30/2022   PCP: Lajean Manes, MD,   REFERRING PROVIDER: Inez Catalina, MD    PT End of Session - 06/30/22 1616     Visit Number 4    Number of Visits 5    Date for PT Re-Evaluation 06/25/22    Authorization Type HTA    Authorization Time Period 05-24-22 - 07-24-22    PT Start Time 1300    PT Stop Time 1345    PT Time Calculation (min) 45 min    Equipment Utilized During Treatment Other (comment)   pool noodle, bar bells   Activity Tolerance Patient tolerated treatment well    Behavior During Therapy WFL for tasks assessed/performed             Past Medical History:  Diagnosis Date   Colon polyps    Hearing impairment    Hiatal hernia    Hx of basal cell carcinoma    Hypercholesteremia    Hypertension    Mild aortic stenosis    Osteoporosis    Seasonal allergies    History reviewed. No pertinent surgical history. Patient Active Problem List   Diagnosis Date Noted   Palliative care encounter 05/03/2022   Nonrheumatic aortic valve stenosis 05/30/2017   COPD, mild ([place]) 06/05/2014    ONSET DATE: April 19, 2022  REFERRING DIAG: M16.11 (ICD-10-CM) - Unilateral primary osteoarthritis, right hip   THERAPY DIAG:  Muscle weakness (generalized)  Unsteadiness on feet  Other abnormalities of gait and mobility  Rationale for Evaluation and Treatment Rehabilitation  SUBJECTIVE:                                                                                                                                                                                              SUBJECTIVE STATEMENT: Pt presents she had some pain in her Rt hip when her caregiver was assisting her with dressing for her pool appt today; took some Tylenol and says it subsided; no pain reported at start of session Pt accompanied by:  caregiver,  [name]  PERTINENT HISTORY: CKD stage 3A, Dementia, GERD and PVD, non-rheumatic aortic stenosis and mild COPD.     PAIN:  Are you having pain? No  PRECAUTIONS: Fall  WEIGHT BEARING RESTRICTIONS No  FALLS: Has patient fallen in last 6 months? No  LIVING ENVIRONMENT: Lives with: lives with an adult companion Lives in: House/apartment Stairs: Yes: External: 4 steps; can reach both in garage Has following equipment at home: Single point cane and Walker - 2  wheeled  PLOF: Independent with basic ADLs, Independent with household mobility without device, Independent with transfers, and Needs assistance with homemaking  PATIENT GOALS Resume balance class at [place]; decrease pain in Rt hip, increase leg strength  OBJECTIVE:  Aquatic therapy at [place] 90 degrees  Patient seen for aquatic therapy today.  Treatment took place in water 3.5-4.5 feet deep depending upon activity.  Pt entered and exited the pool via step negotiation with use of hand rails with CGA using step by step sequence with descension and step over step sequence with descension.  Pt performed water walking forward 18' x 4 reps, sideways 18' x 4 reps, backwards 18' x 4 reps with UE support on large yellow noodle with CGA to min assist for stabilization of noodle for balance  Marching in place 10 reps x 2 sets with UE support on yellow noodle; marching across pool (18') with UE support on noodle x 2 reps with CGA for balance  Small squats  20 reps  - bil. UE support on yellow noodle Heel raises 10 reps  with UE support on pool edge  Pt stood in 4.0 water depth holding bar bells - performed shoulder protraction/retraction 15 reps for core stabilization; performed horizontal shoulder abduction/adduction with small bar bells 10 reps for core stabilization - CGA for balance  Pt performed standing hip flexion, abduction and extension 10 reps each leg holding onto pool noodle, stabilized by PT, for support and assist  with balance  Pt amb. Forward across width of pool (18') x 2 reps at end of session - holding onto bar bells - cues to move bar bells forward/back    PATIENT EDUCATION: Education details: gave pt & caregiver info on aquatic therapy Person educated: Patient and Caregiver   Education method: Explanation Education comprehension: verbalized understanding   HOME EXERCISE PROGRAM: To be established     GOALS: Goals reviewed with patient? Yes   LONG TERM GOALS: Target date: 06/29/2022; EXTEND target date to 07-12-22 due to only 2 sessions attended to date (06-23-22)   1.  Pt will perform aquatic exercise program with caregiver's assistance.  Baseline:  Goal status: Ongoing - in progress - pt has attended 3 aquatic therapy sessions to date  2.  Pt will increase endurance so that she is able to participate in 45" aquatic therapy session with </= min. C/o fatigue. Baseline:  Goal status: Goal met 06-29-22  3.  Pt will report at least 50% improvement in Rt hip pain after completion of aquatic therapy sessions.  Baseline:  Goal status: Ongoing   ASSESSMENT:  CLINICAL IMPRESSION: Pt has met LTG #2 with LTG's #1 and 3 ongoing as pt has only attended 3 aquatic therapy sessions since initial eval on 05-24-22.  Pt's endurance with aquatic exercises is improving as pt able to tolerate 40" aquatic therapy session with minimal c/o fatigue at end of today's session.  Pt descended steps using step over step sequence (first time in aquatic PT) to exit pool today.  Cont with POC.     OBJECTIVE IMPAIRMENTS decreased endurance, difficulty walking, decreased strength, and pain.   ACTIVITY LIMITATIONS bending, squatting, transfers, and locomotion level  PARTICIPATION LIMITATIONS: meal prep, laundry, shopping, and community activity  PERSONAL FACTORS Age and Past/current experiences are also affecting patient's functional outcome.   REHAB POTENTIAL: Good  CLINICAL DECISION MAKING:  Stable/uncomplicated  EVALUATION COMPLEXITY: Low  PLAN: PT FREQUENCY: 1x/week  PT DURATION: 4 weeks  PLANNED INTERVENTIONS: Patient/Family education, Self Care,  and Aquatic Therapy  PLAN FOR NEXT SESSION: cont with balance & gait training in pool, strengthening exs for bil. LE's   Roman, Sandall, PT 06/30/2022, 4:33 PM

## 2022-07-01 DIAGNOSIS — M25551 Pain in right hip: Secondary | ICD-10-CM | POA: Diagnosis not present

## 2022-07-01 DIAGNOSIS — M79604 Pain in right leg: Secondary | ICD-10-CM | POA: Diagnosis not present

## 2022-07-05 ENCOUNTER — Ambulatory Visit: Payer: PPO | Attending: Geriatric Medicine | Admitting: Physical Therapy

## 2022-07-05 DIAGNOSIS — M6281 Muscle weakness (generalized): Secondary | ICD-10-CM | POA: Diagnosis not present

## 2022-07-05 DIAGNOSIS — R2689 Other abnormalities of gait and mobility: Secondary | ICD-10-CM | POA: Insufficient documentation

## 2022-07-05 DIAGNOSIS — R2681 Unsteadiness on feet: Secondary | ICD-10-CM | POA: Diagnosis not present

## 2022-07-06 ENCOUNTER — Encounter: Payer: Self-pay | Admitting: Physical Therapy

## 2022-07-06 NOTE — Therapy (Signed)
OUTPATIENT PHYSICAL THERAPY NEURO TREATMENT NOTE   Patient Name: Valerie Villa MRN: [id number] DOB:[birth year], 88 y.o., female Today's Date: 07/06/2022   PCP: Lajean Manes, MD,   REFERRING PROVIDER: Inez Catalina, MD    PT End of Session - 07/06/22 1918     Visit Number 5    Number of Visits 9   4 additional visits per recert [id number]   Date for PT Re-Evaluation 06/25/22    Authorization Type HTA    Authorization Time Period 05-24-22 - 07-24-22; 07-05-22 - 09-04-22    PT Start Time 1320    PT Stop Time 1400    PT Time Calculation (min) 40 min    Equipment Utilized During Treatment Other (comment)   pool noodle, bar bells   Activity Tolerance Patient tolerated treatment well    Behavior During Therapy WFL for tasks assessed/performed             Past Medical History:  Diagnosis Date   Colon polyps    Hearing impairment    Hiatal hernia    Hx of basal cell carcinoma    Hypercholesteremia    Hypertension    Mild aortic stenosis    Osteoporosis    Seasonal allergies    History reviewed. No pertinent surgical history. Patient Active Problem List   Diagnosis Date Noted   Palliative care encounter 05/03/2022   Nonrheumatic aortic valve stenosis 05/30/2017   COPD, mild ([place]) 06/05/2014    ONSET DATE: April 19, 2022  REFERRING DIAG: M16.11 (ICD-10-CM) - Unilateral primary osteoarthritis, right hip   THERAPY DIAG:  Muscle weakness (generalized) - Plan: PT plan of care cert/re-cert  Unsteadiness on feet - Plan: PT plan of care cert/re-cert  Other abnormalities of gait and mobility - Plan: PT plan of care cert/re-cert  Rationale for Evaluation and Treatment Rehabilitation  SUBJECTIVE:                                                                                                                                                                                              SUBJECTIVE STATEMENT: Pt presents she had some pain in her Rt hip when her caregiver was  assisting her with dressing for her pool appt today; took some Tylenol and says it subsided; no pain reported at start of session Pt accompanied by:  caregiver, [name]  PERTINENT HISTORY: CKD stage 3A, Dementia, GERD and PVD, non-rheumatic aortic stenosis and mild COPD.     PAIN:  Are you having pain? No  PRECAUTIONS: Fall  WEIGHT BEARING RESTRICTIONS No  FALLS: Has patient fallen in last 6 months? No  LIVING ENVIRONMENT: Lives  with: lives with an adult companion Lives in: House/apartment Stairs: Yes: External: 4 steps; can reach both in garage Has following equipment at home: Single point cane and Walker - 2 wheeled  PLOF: Independent with basic ADLs, Independent with household mobility without device, Independent with transfers, and Needs assistance with homemaking  PATIENT GOALS Resume balance class at [place]; decrease pain in Rt hip, increase leg strength  OBJECTIVE:  Aquatic therapy at [place] 90 degrees  Patient seen for aquatic therapy today.  Treatment took place in water 3.5-4.5 feet deep depending upon activity.  Pt entered and exited the pool via step negotiation with use of hand rails with CGA using step by step sequence with descension and step over step sequence with descension.  Pt performed water walking forward 18' x 4 reps & sideways 18' x 4 reps with UE support on large yellow noodle with CGA to min assist for stabilization of noodle for balance  Marching in place 10 reps x 2 sets with UE support on yellow noodle; marching across pool (18') with UE support on noodle x 2 reps with CGA for balance  Heel raises 10 reps  with UE support on pool edge Squats - small range - 10 reps - with bil. UE support on large yellow noodle  Pt performed standing hip flexion, abduction and extension 10 reps each leg holding onto pool noodle, stabilized by PT, for support and assist with balance  Pt amb. Forward across width of pool (18') x 4 reps at end of session  - holding onto bar bells - cues to move bar bells forward/back    PATIENT EDUCATION: Education details: gave pt & caregiver info on aquatic therapy Person educated: Patient and Caregiver   Education method: Explanation Education comprehension: verbalized understanding   HOME EXERCISE PROGRAM: To be established     GOALS: Goals reviewed with patient? Yes   LONG TERM GOALS: Target date: 06/29/2022;   NEW TARGET DATE  [id number] with recert [id number]:   1.  Pt will perform aquatic exercise program with caregiver's assistance.  Baseline:  Goal status: Ongoing - in progress - pt has attended 3 aquatic therapy sessions to date  2.  Pt will increase endurance so that she is able to participate in 45" aquatic therapy session with </= min. C/o fatigue. Baseline:  Goal status: Goal met 06-29-22  3.  Pt will report at least 50% improvement in Rt hip pain after completion of aquatic therapy sessions.  Baseline:  Goal status: Ongoing - pt reports onset of Rt hip pain on 06-30-22  4.  Pt will report at least 50% improvement in Rt hip pain to increase ease and comfort with mobility and ADL's.  Baseline:  new occurrence of Rt hip pain on 06-30-22 - pt unable to rate intensity of pain  Goal status:  New     ASSESSMENT:  CLINICAL IMPRESSION: PT session focused on gentle ROM hip exercises with bil. UE support on yellow noodle with min assist for stabilization by PT and also on water walking in various directions for balance and strengthening.  Pt's mobility and safety is significantly increased in aquatic environment compared to performance on land with fall risk due to unsteadiness.  LTG's #1 & 3 are ongoing with LTG #2 met:  LTG #4 is newly established for this new cert period.  Cont with POC.     OBJECTIVE IMPAIRMENTS decreased endurance, difficulty walking, decreased strength, and pain.   ACTIVITY LIMITATIONS bending, squatting, transfers,  and locomotion level  PARTICIPATION LIMITATIONS:  meal prep, laundry, shopping, and community activity  PERSONAL FACTORS Age and Past/current experiences are also affecting patient's functional outcome.   REHAB POTENTIAL: Good  CLINICAL DECISION MAKING: Stable/uncomplicated  EVALUATION COMPLEXITY: Low  PLAN: PT FREQUENCY: 1x/week  PT DURATION: 5 weeks  PLANNED INTERVENTIONS: Patient/Family education, Self Care, and Aquatic Therapy  PLAN FOR NEXT SESSION: cont with balance & gait training in pool, strengthening exs for bil. LE's   Namira, [place]krans, PT 07/06/2022, 7:47 PM

## 2022-07-08 ENCOUNTER — Other Ambulatory Visit: Payer: PPO | Admitting: Family Medicine

## 2022-07-12 ENCOUNTER — Ambulatory Visit: Payer: PPO | Admitting: Physical Therapy

## 2022-07-13 DIAGNOSIS — N390 Urinary tract infection, site not specified: Secondary | ICD-10-CM | POA: Diagnosis not present

## 2022-07-14 ENCOUNTER — Telehealth: Payer: Self-pay | Admitting: Family Medicine

## 2022-07-14 NOTE — Telephone Encounter (Signed)
[name], pt's private pay caregiver. Advised of reason for missed visit last week and follow up of daughter's call over the weekend with UTI sx.  Griffey states that pt began having a slight odor to her urine, was having increased confusion and hallucinations of bugs in her home, strange men in her bed.  Family called to new PCP, Dr Angela Adam who prescribed antibiotics.  [place] states today is the first day Valerie Villa has been close to her baseline normal.  Antibiotics were started on Monday, she took a urine sample yesterday and indicated urine was "rust-colored with things swimming in it."  She has not heard back about results yet. She could not do an appointment Friday as she already had plans, scheduled her for Monday 07/19/22 at 11 am. Damaris Hippo FNP-C

## 2022-07-19 ENCOUNTER — Ambulatory Visit: Payer: PPO | Admitting: Physical Therapy

## 2022-07-19 ENCOUNTER — Other Ambulatory Visit: Payer: PPO | Admitting: Family Medicine

## 2022-07-21 ENCOUNTER — Ambulatory Visit: Payer: PPO | Attending: Sports Medicine

## 2022-07-21 DIAGNOSIS — R293 Abnormal posture: Secondary | ICD-10-CM

## 2022-07-21 DIAGNOSIS — M1611 Unilateral primary osteoarthritis, right hip: Secondary | ICD-10-CM | POA: Diagnosis not present

## 2022-07-21 DIAGNOSIS — M25551 Pain in right hip: Secondary | ICD-10-CM

## 2022-07-21 DIAGNOSIS — M6281 Muscle weakness (generalized): Secondary | ICD-10-CM

## 2022-07-21 DIAGNOSIS — R2689 Other abnormalities of gait and mobility: Secondary | ICD-10-CM

## 2022-07-21 DIAGNOSIS — R2681 Unsteadiness on feet: Secondary | ICD-10-CM

## 2022-07-21 NOTE — Therapy (Addendum)
OUTPATIENT PHYSICAL THERAPY EVALUATION   Patient Name: Valerie Villa MRN: [id number] DOB:[birth year], 88 y.o., female Today's Date: 07/21/2022   PCP: Lajean Manes, MD,   REFERRING PROVIDER: Inez Catalina, MD    PT End of Session - 07/21/22 1249     Visit Number 6    Date for PT Re-Evaluation 09/14/22    Progress Note Due on Visit 10    PT Start Time 1148    PT Stop Time 1226    PT Time Calculation (min) 38 min    Activity Tolerance Patient tolerated treatment well    Behavior During Therapy WFL for tasks assessed/performed              Past Medical History:  Diagnosis Date   Colon polyps    Hearing impairment    Hiatal hernia    Hx of basal cell carcinoma    Hypercholesteremia    Hypertension    Mild aortic stenosis    Osteoporosis    Seasonal allergies    History reviewed. No pertinent surgical history. Patient Active Problem List   Diagnosis Date Noted   Palliative care encounter 05/03/2022   Nonrheumatic aortic valve stenosis 05/30/2017   COPD, mild ([place]) 06/05/2014    ONSET DATE: April 19, 2022  REFERRING DIAG: M16.11 (ICD-10-CM) - Unilateral primary osteoarthritis, right hip   THERAPY DIAG:  Muscle weakness (generalized) - Plan: PT plan of care cert/re-cert  Unsteadiness on feet - Plan: PT plan of care cert/re-cert  Other abnormalities of gait and mobility - Plan: PT plan of care cert/re-cert  Pain in right hip - Plan: PT plan of care cert/re-cert  Abnormal posture - Plan: PT plan of care cert/re-cert  Rationale for Evaluation and Treatment Rehabilitation  SUBJECTIVE:                                                                                                                                                                                              SUBJECTIVE STATEMENT: I am here to work on my back and hips.  Dr Sheppard Coil wants pt to come to PT on land 2x/week and do aquatics 1x/wk.   Pt accompanied by:  caregiver, [name]  PERTINENT  HISTORY: CKD stage 3A, Dementia, GERD and PVD, non-rheumatic aortic stenosis and mild COPD.     PAIN:  PAIN:  Are you having pain? Yes no pain today. NPRS scale: pt is not able to rate.  Caregiver reports that she is 10/10 at times when she is in a lot of pain and asks for Tylenol  Pain location: bil hips and low back  Pain orientation: Bilateral  PAIN TYPE: aching and dull Pain description: intermittent  Aggravating factors: sit to stand, sitting too long  Relieving factors: sitting on a cushion, Tylenol   PRECAUTIONS: Fall  WEIGHT BEARING RESTRICTIONS No  FALLS: Has patient fallen in last 6 months? No  LIVING ENVIRONMENT: Lives with: lives with an adult companion Lives in: House/apartment Stairs: Yes: External: 4 steps; can reach both in garage Has following equipment at home: Single point cane and Walker - 2 wheeled  PLOF: Independent with basic ADLs, Independent with household mobility without device, Independent with transfers, and Needs assistance with homemaking  PATIENT GOALS Resume balance class at [facility]; decrease pain in Rt hip, increase leg strength  OBJECTIVE:   07/21/22: 5x sit to stand: 07/21/22: 26 seconds  TUG: 21 seconds without device Bil knees 4+/5, ankles 4/5, hips 4/5 throughout   Date: 07/21/22 HEP established-see below     PATIENT EDUCATION: Education details: gave pt & caregiver info on aquatic therapy, Access Code: [email or web address] (07/21/22) Person educated: Patient and Caregiver   Education method: Explanation Education comprehension: verbalized understanding   HOME EXERCISE PROGRAM: Access Code: [email or web address] URL: [email or web address] Date: 07/21/2022 Prepared by: Claiborne Billings  Exercises - Seated Long Arc Quad  - 3 x daily - 7 x weekly - 2 sets - 10 reps - 5 hold - Seated March   - 3 x daily - 7 x weekly - 3 sets - 10 reps - Seated Heel Toe Raises   - 3 x daily - 7 x weekly - 2 sets - 10 reps - Sit to Stand  - 2 x daily - 7 x weekly  - 2 sets - 5 reps GOALS: Goals reviewed with patient? Yes   LONG TERM GOALS: Target date: 09/04/22     1.  Pt will perform aquatic exercise program and land based exercises with caregiver's assistance.  Baseline:  Goal status: Ongoing - in progress   2.  Pt will increase endurance so that she is able to participate in 45" aquatic therapy session with </= min. C/o fatigue. Baseline:  Goal status: Goal met 06-29-22  3.  Pt will report at least 50% improvement in Rt hip pain after completion of aquatic therapy sessions.  Baseline:  Goal status: Ongoing - pt reports onset of Rt hip pain on 06-30-22  4.  Pt will report at least 50% improvement in Rt hip pain to increase ease and comfort with mobility and ADL's.  Baseline:  new occurrence of Rt hip pain on 06-30-22 - pt unable to rate intensity of pain  Goal status:  New   5. Perform 5x sit to stand in < or = to 19 seconds to improve balance Baseline: 26 seconds (07/21/22) NEW  6. Perform TUG in < or 16 seconds to improve balance  Baseline: 21 seconds  NEW    ASSESSMENT:  CLINICAL IMPRESSION: Pt arrived at PT with her aide, [place].  She reports that Dr Sheppard Coil wants her to do more land based activity.  Pt has active POC and PT evaluated for land based activity and balance and updated goals.  Pt demonstrates falls risk based on TUG and 5x sit to stand testing today.  Pt is reluctant to exercise at home due to chronic OA and DDD in back and hips.  Patient will benefit from skilled PT to address the below impairments and improve overall function.    OBJECTIVE IMPAIRMENTS decreased endurance, difficulty walking, decreased strength, and pain.   ACTIVITY LIMITATIONS bending, squatting, transfers, and locomotion level  PARTICIPATION LIMITATIONS: meal prep, laundry, shopping, and community activity  PERSONAL FACTORS Age and Past/current experiences are also affecting patient's functional outcome.   REHAB POTENTIAL: Good  CLINICAL  DECISION MAKING: Stable/uncomplicated  EVALUATION COMPLEXITY: Low  PLAN: PT FREQUENCY: 3x/wk (2 land, 1 aquatic)  PT DURATION: 6 weeks   PLANNED INTERVENTIONS: Patient/Family education, Self Care, and Aquatic Therapy  PLAN FOR NEXT SESSION: cont with balance & gait training in pool, strengthening exs for bil. LE's, 2x on land for balance , 1x  aquatic treatment.  Work on Insurance underwriter and endurance    Sigurd Sos, PT 07/21/22 1:17 PM   [facility] [address] [place] [address] [place], [place] [address] Phone # [phone] Fax [phone]

## 2022-07-28 ENCOUNTER — Ambulatory Visit: Payer: PPO | Admitting: Physical Therapy

## 2022-08-02 ENCOUNTER — Ambulatory Visit: Payer: PPO | Admitting: Physical Therapy

## 2022-08-02 NOTE — Therapy (Deleted)
OUTPATIENT PHYSICAL THERAPY NEURO TREATMENT NOTE   Patient Name: Valerie Villa MRN: [id number] DOB:16-Mar-1924, 88 y.o., female Today's Date: 08/02/2022   PCP: Valerie Manes, MD,   REFERRING PROVIDER: Inez Catalina, MD       Past Medical History:  Diagnosis Date   Colon polyps    Hearing impairment    Hiatal hernia    Hx of basal cell carcinoma    Hypercholesteremia    Hypertension    Mild aortic stenosis    Osteoporosis    Seasonal allergies    No past surgical history on file. Patient Active Problem List   Diagnosis Date Noted   Palliative care encounter 05/03/2022   Nonrheumatic aortic valve stenosis 05/30/2017   COPD, mild ([place]) 06/05/2014    ONSET DATE: April 19, 2022  REFERRING DIAG: M16.11 (ICD-10-CM) - Unilateral primary osteoarthritis, right hip   THERAPY DIAG:  Muscle weakness (generalized)  Unsteadiness on feet  Other abnormalities of gait and mobility  Pain in right hip  Abnormal posture  Rationale for Evaluation and Treatment Rehabilitation  SUBJECTIVE:                                                                                                                                                                                              SUBJECTIVE STATEMENT: I am here to work on my back and hips.  Dr Valerie Villa wants pt to come to PT on land 2x/week and do aquatics 1x/wk.   Pt accompanied by:  caregiver, Valerie Villa  PERTINENT HISTORY: CKD stage 3A, Dementia, GERD and PVD, non-rheumatic aortic stenosis and mild COPD.     PAIN:  PAIN:  Are you having pain? Yes no pain today. NPRS scale: pt is not able to rate.  Caregiver reports that she is 10/10 at times when she is in a lot of pain and asks for Tylenol  Pain location: bil hips and low back  Pain orientation: Bilateral  PAIN TYPE: aching and dull Pain description: intermittent  Aggravating factors: sit to stand, sitting too long  Relieving factors: sitting on a cushion, Tylenol    PRECAUTIONS: Fall  WEIGHT BEARING RESTRICTIONS No  FALLS: Has patient fallen in last 6 months? No  LIVING ENVIRONMENT: Lives with: lives with an adult companion Lives in: House/apartment Stairs: Yes: External: 4 steps; can reach both in garage Has following equipment at home: Single point cane and Walker - 2 wheeled  PLOF: Independent with basic ADLs, Independent with household mobility without device, Independent with transfers, and Needs assistance with homemaking  PATIENT GOALS Resume balance class at [facility]; decrease pain in Rt hip, increase leg strength  OBJECTIVE:   07/21/22: 5x sit to stand: 07/21/22: 26 seconds  TUG: 21 seconds without device Bil knees 4+/5, ankles 4/5, hips 4/5 throughout  Treatment:  08/02/22:     Date: 07/21/22 HEP established-see below    Aquatic therapy at [facility] - pool temp 90 degrees  Patient seen for aquatic therapy today.  Treatment took place in water 3.5-4.5 feet deep depending upon activity.  Pt entered and exited the pool via step negotiation with use of hand rails with CGA using step by step sequence with descension and step over step sequence with descension.  Pt performed water walking forward 18' x 4 reps & sideways 18' x 4 reps with UE support on large yellow noodle with CGA to min assist for stabilization of noodle for balance  Marching in place 10 reps x 2 sets with UE support on yellow noodle; marching across pool (18') with UE support on noodle x 2 reps with CGA for balance  Heel raises 10 reps  with UE support on pool edge Squats - small range - 10 reps - with bil. UE support on large yellow noodle  Pt performed standing hip flexion, abduction and extension 10 reps each leg holding onto pool noodle, stabilized by PT, for support and assist with balance  Pt amb. Forward across width of pool (18') x 4 reps at end of session - holding onto bar bells - cues to move bar bells forward/back    PATIENT  EDUCATION: Education details: gave pt & caregiver info on aquatic therapy, Access Code: [email or web address] (07/21/22) Person educated: Patient and Caregiver   Education method: Explanation Education comprehension: verbalized understanding   HOME EXERCISE PROGRAM: Access Code: [email or web address] URL: [email or web address] Date: 07/21/2022 Prepared by: Valerie Villa  Exercises - Seated Long Arc Quad  - 3 x daily - 7 x weekly - 2 sets - 10 reps - 5 hold - Seated March   - 3 x daily - 7 x weekly - 3 sets - 10 reps - Seated Heel Toe Raises   - 3 x daily - 7 x weekly - 2 sets - 10 reps - Sit to Stand  - 2 x daily - 7 x weekly - 2 sets - 5 reps GOALS: Goals reviewed with patient? Yes   LONG TERM GOALS: Target date: 09/04/22     1.  Pt will perform aquatic exercise program and land based exercises with caregiver's assistance.  Baseline:  Goal status: Ongoing - in progress   2.  Pt will increase endurance so that she is able to participate in 45" aquatic therapy session with </= min. C/o fatigue. Baseline:  Goal status: Goal met 06-29-22  3.  Pt will report at least 50% improvement in Rt hip pain after completion of aquatic therapy sessions.  Baseline:  Goal status: Ongoing - pt reports onset of Rt hip pain on 06-30-22  4.  Pt will report at least 50% improvement in Rt hip pain to increase ease and comfort with mobility and ADL's.  Baseline:  new occurrence of Rt hip pain on 06-30-22 - pt unable to rate intensity of pain  Goal status:  New   5. Perform 5x sit to stand in < or = to 19 seconds to improve balance Baseline: 26 seconds (07/21/22) NEW  6. Perform TUG in < or 16 seconds to improve balance  Baseline: 21 seconds  NEW    ASSESSMENT:  CLINICAL IMPRESSION: Pt arrived at PT with her aide, Valerie Villa.  She reports that Dr Valerie Villa wants  her to do more land based activity.  Pt has active POC and PT evaluated for land based activity and balance and updated goals.  Pt demonstrates falls risk  based on TUG and 5x sit to stand testing today.  Pt is reluctant to exercise at home due to chronic OA and DDD in back and hips.  Patient will benefit from skilled PT to address the below impairments and improve overall function.    OBJECTIVE IMPAIRMENTS decreased endurance, difficulty walking, decreased strength, and pain.   ACTIVITY LIMITATIONS bending, squatting, transfers, and locomotion level  PARTICIPATION LIMITATIONS: meal prep, laundry, shopping, and community activity  PERSONAL FACTORS Age and Past/current experiences are also affecting patient's functional outcome.   REHAB POTENTIAL: Good  CLINICAL DECISION MAKING: Stable/uncomplicated  EVALUATION COMPLEXITY: Low  PLAN: PT FREQUENCY: 3x/wk (2 land, 1 aquatic)  PT DURATION: 6 weeks   PLANNED INTERVENTIONS: Patient/Family education, Self Care, and Aquatic Therapy  PLAN FOR NEXT SESSION: cont with balance & gait training in pool, strengthening exs for bil. LE's, 2x on land for balance , 1x  aquatic treatment.  Work on balance and endurance    Myrene Galas, PTA 08/02/22 8:06 AM  08/02/22 8:05 AM   [facility] [address], [place], [place] [address] Phone # [phone] Fax [phone]

## 2022-08-09 ENCOUNTER — Ambulatory Visit: Payer: PPO | Admitting: Physical Therapy

## 2022-08-09 NOTE — Therapy (Deleted)
OUTPATIENT PHYSICAL THERAPY NEURO TREATMENT NOTE   Patient Name: Valerie Villa MRN: [id number] DOB:12/03/1923, 88 y.o., female Today's Date: 08/09/2022   PCP: Lajean Manes, MD,   REFERRING PROVIDER: Inez Catalina, MD       Past Medical History:  Diagnosis Date   Colon polyps    Hearing impairment    Hiatal hernia    Hx of basal cell carcinoma    Hypercholesteremia    Hypertension    Mild aortic stenosis    Osteoporosis    Seasonal allergies    No past surgical history on file. Patient Active Problem List   Diagnosis Date Noted   Palliative care encounter 05/03/2022   Nonrheumatic aortic valve stenosis 05/30/2017   COPD, mild ([facility]) 06/05/2014    ONSET DATE: April 19, 2022  REFERRING DIAG: M16.11 (ICD-10-CM) - Unilateral primary osteoarthritis, right hip   THERAPY DIAG:  Muscle weakness (generalized)  Unsteadiness on feet  Other abnormalities of gait and mobility  Pain in right hip  Abnormal posture  Rationale for Evaluation and Treatment Rehabilitation  SUBJECTIVE:                                                                                                                                                                                              SUBJECTIVE STATEMENT: I am here to work on my back and hips.  Dr Sheppard Coil wants pt to come to PT on land 2x/week and do aquatics 1x/wk.   Pt accompanied by:  caregiver, [name]  PERTINENT HISTORY: CKD stage 3A, Dementia, GERD and PVD, non-rheumatic aortic stenosis and mild COPD.     PAIN:  PAIN:  Are you having pain? Yes no pain today. NPRS scale: pt is not able to rate.  Caregiver reports that she is 10/10 at times when she is in a lot of pain and asks for Tylenol  Pain location: bil hips and low back  Pain orientation: Bilateral  PAIN TYPE: aching and dull Pain description: intermittent  Aggravating factors: sit to stand, sitting too long  Relieving factors: sitting on a cushion, Tylenol    PRECAUTIONS: Fall  WEIGHT BEARING RESTRICTIONS No  FALLS: Has patient fallen in last 6 months? No  LIVING ENVIRONMENT: Lives with: lives with an adult companion Lives in: House/apartment Stairs: Yes: External: 4 steps; can reach both in garage Has following equipment at home: Single point cane and Walker - 2 wheeled  PLOF: Independent with basic ADLs, Independent with household mobility without device, Independent with transfers, and Needs assistance with homemaking  PATIENT GOALS Resume balance class at [facility]; decrease pain in Rt hip, increase leg strength  OBJECTIVE:   07/21/22: 5x sit to stand: 07/21/22: 26 seconds  TUG: 21 seconds without device Bil knees 4+/5, ankles 4/5, hips 4/5 throughout  Treatment:  08/09/22:     Date: 07/21/22 HEP established-see below    Aquatic therapy at [facility] - pool temp 90 degrees  Patient seen for aquatic therapy today.  Treatment took place in water 3.5-4.5 feet deep depending upon activity.  Pt entered and exited the pool via step negotiation with use of hand rails with CGA using step by step sequence with descension and step over step sequence with descension.  Pt performed water walking forward 18' x 4 reps & sideways 18' x 4 reps with UE support on large yellow noodle with CGA to min assist for stabilization of noodle for balance  Marching in place 10 reps x 2 sets with UE support on yellow noodle; marching across pool (18') with UE support on noodle x 2 reps with CGA for balance  Heel raises 10 reps  with UE support on pool edge Squats - small range - 10 reps - with bil. UE support on large yellow noodle  Pt performed standing hip flexion, abduction and extension 10 reps each leg holding onto pool noodle, stabilized by PT, for support and assist with balance  Pt amb. Forward across width of pool (18') x 4 reps at end of session - holding onto bar bells - cues to move bar bells forward/back    PATIENT  EDUCATION: Education details: gave pt & caregiver info on aquatic therapy, Access Code: [email or web address] (07/21/22) Person educated: Patient and Caregiver   Education method: Explanation Education comprehension: verbalized understanding   HOME EXERCISE PROGRAM: Access Code: [email or web address] URL: [email or web address] Date: 07/21/2022 Prepared by: Claiborne Billings  Exercises - Seated Long Arc Quad  - 3 x daily - 7 x weekly - 2 sets - 10 reps - 5 hold - Seated March   - 3 x daily - 7 x weekly - 3 sets - 10 reps - Seated Heel Toe Raises   - 3 x daily - 7 x weekly - 2 sets - 10 reps - Sit to Stand  - 2 x daily - 7 x weekly - 2 sets - 5 reps GOALS: Goals reviewed with patient? Yes   LONG TERM GOALS: Target date: 09/04/22     1.  Pt will perform aquatic exercise program and land based exercises with caregiver's assistance.  Baseline:  Goal status: Ongoing - in progress   2.  Pt will increase endurance so that she is able to participate in 45" aquatic therapy session with </= min. C/o fatigue. Baseline:  Goal status: Goal met 06-29-22  3.  Pt will report at least 50% improvement in Rt hip pain after completion of aquatic therapy sessions.  Baseline:  Goal status: Ongoing - pt reports onset of Rt hip pain on 06-30-22  4.  Pt will report at least 50% improvement in Rt hip pain to increase ease and comfort with mobility and ADL's.  Baseline:  new occurrence of Rt hip pain on 06-30-22 - pt unable to rate intensity of pain  Goal status:  New   5. Perform 5x sit to stand in < or = to 19 seconds to improve balance Baseline: 26 seconds (07/21/22) NEW  6. Perform TUG in < or 16 seconds to improve balance  Baseline: 21 seconds  NEW    ASSESSMENT:  CLINICAL IMPRESSION: Pt arrived at PT with her aide, [place].  She reports that Dr Sheppard Coil wants  her to do more land based activity.  Pt has active POC and PT evaluated for land based activity and balance and updated goals.  Pt demonstrates falls risk  based on TUG and 5x sit to stand testing today.  Pt is reluctant to exercise at home due to chronic OA and DDD in back and hips.  Patient will benefit from skilled PT to address the below impairments and improve overall function.    OBJECTIVE IMPAIRMENTS decreased endurance, difficulty walking, decreased strength, and pain.   ACTIVITY LIMITATIONS bending, squatting, transfers, and locomotion level  PARTICIPATION LIMITATIONS: meal prep, laundry, shopping, and community activity  PERSONAL FACTORS Age and Past/current experiences are also affecting patient's functional outcome.   REHAB POTENTIAL: Good  CLINICAL DECISION MAKING: Stable/uncomplicated  EVALUATION COMPLEXITY: Low  PLAN: PT FREQUENCY: 3x/wk (2 land, 1 aquatic)  PT DURATION: 6 weeks   PLANNED INTERVENTIONS: Patient/Family education, Self Care, and Aquatic Therapy  PLAN FOR NEXT SESSION: cont with balance & gait training in pool, strengthening exs for bil. LE's, 2x on land for balance , 1x  aquatic treatment.  Work on balance and endurance    Myrene Galas, PTA 08/09/22 10:27 AM  08/09/22 10:27 AM   [facility] [address], [place], [place] [address] Phone # [phone] Fax [phone]

## 2022-08-12 ENCOUNTER — Ambulatory Visit: Payer: PPO

## 2022-08-16 ENCOUNTER — Ambulatory Visit: Payer: PPO | Attending: Sports Medicine | Admitting: Physical Therapy

## 2022-08-16 ENCOUNTER — Encounter: Payer: Self-pay | Admitting: Physical Therapy

## 2022-08-16 DIAGNOSIS — R293 Abnormal posture: Secondary | ICD-10-CM | POA: Insufficient documentation

## 2022-08-16 DIAGNOSIS — R2689 Other abnormalities of gait and mobility: Secondary | ICD-10-CM | POA: Insufficient documentation

## 2022-08-16 DIAGNOSIS — R2681 Unsteadiness on feet: Secondary | ICD-10-CM | POA: Diagnosis not present

## 2022-08-16 DIAGNOSIS — M6281 Muscle weakness (generalized): Secondary | ICD-10-CM | POA: Insufficient documentation

## 2022-08-16 DIAGNOSIS — M25551 Pain in right hip: Secondary | ICD-10-CM | POA: Diagnosis not present

## 2022-08-16 NOTE — Therapy (Addendum)
OUTPATIENT PHYSICAL THERAPY ORTHO TREATMENT NOTE   Patient Name: Valerie Villa MRN: [id number] DOB:15-Aug-1924, 88 y.o., female Today's Date: 08/16/2022   PCP: Lajean Manes, MD,   REFERRING PROVIDER: Inez Catalina, MD    PT End of Session - 08/16/22 1107     Visit Number 7    Number of Visits 9    Date for PT Re-Evaluation 09/14/22    Authorization Type HTA    Authorization Time Period 05-24-22 - 07-24-22; 07-05-22 - 09-04-22    Progress Note Due on Visit 10    PT Start Time 1106    PT Stop Time 1145    PT Time Calculation (min) 39 min    Activity Tolerance Patient tolerated treatment well    Behavior During Therapy [facility] for tasks assessed/performed               Past Medical History:  Diagnosis Date   Colon polyps    Hearing impairment    Hiatal hernia    Hx of basal cell carcinoma    Hypercholesteremia    Hypertension    Mild aortic stenosis    Osteoporosis    Seasonal allergies    History reviewed. No pertinent surgical history. Patient Active Problem List   Diagnosis Date Noted   Palliative care encounter 05/03/2022   Nonrheumatic aortic valve stenosis 05/30/2017   COPD, mild ([place]) 06/05/2014    ONSET DATE: April 19, 2022  REFERRING DIAG: M16.11 (ICD-10-CM) - Unilateral primary osteoarthritis, right hip   THERAPY DIAG:  Muscle weakness (generalized)  Unsteadiness on feet  Other abnormalities of gait and mobility  Pain in right hip  Abnormal posture  Rationale for Evaluation and Treatment Rehabilitation  SUBJECTIVE:                                                                                                                                                                                              SUBJECTIVE STATEMENT: Pt is accompanied by her care giver, Nold. Pt expresses desire to stop the formal PT (pt is non-compliant with HEP) but is interested in resuming her [facility] exercise classes that she enjoys. She denies having much hip or  back pain as of late.   PERTINENT HISTORY: CKD stage 3A, Dementia, GERD and PVD, non-rheumatic aortic stenosis and mild COPD.     PAIN:  PAIN:  Are you having pain? Yes no pain today. NPRS scale: pt is not able to rate.  Caregiver reports that she is 10/10 at times when she is in a lot of pain and asks for Tylenol  Pain location: bil hips and low back  Pain orientation: Bilateral  PAIN TYPE: aching and dull Pain description: intermittent  Aggravating factors: sit to stand, sitting too long  Relieving factors: sitting on a cushion, Tylenol   PRECAUTIONS: Fall  WEIGHT BEARING RESTRICTIONS No  FALLS: Has patient fallen in last 6 months? No  LIVING ENVIRONMENT: Lives with: lives with an adult companion Lives in: House/apartment Stairs: Yes: External: 4 steps; can reach both in garage Has following equipment at home: Single point cane and Walker - 2 wheeled  PLOF: Independent with basic ADLs, Independent with household mobility without device, Independent with transfers, and Needs assistance with homemaking  PATIENT GOALS Resume balance class at [facility]; decrease pain in Rt hip, increase leg strength  OBJECTIVE:   07/21/22: 5x sit to stand: 07/21/22: 26 seconds  TUG: 21 seconds without device Bil knees 4+/5, ankles 4/5, hips 4/5 throughout  Treatment:  08/16/22: Review of exercises/ activity that pt would agree to do most consistently with her caregiver.  PTA taught Pt and caregiver bed stretches she can do if and when she has pain; single knee to chest and gentle lower trunk rotation. Pt declines papers/visuals. Pt could return demo with verbal cuing from caregiver.  Pt not interested in repeating any tasks (formal testing).   Date: 07/21/22 HEP established-see below    Aquatic therapy at [facility] - pool temp 90 degrees  Patient seen for aquatic therapy today.  Treatment took place in water 3.5-4.5 feet deep depending upon activity.  Pt entered and exited the pool via  step negotiation with use of hand rails with CGA using step by step sequence with descension and step over step sequence with descension.  Pt performed water walking forward 18' x 4 reps & sideways 18' x 4 reps with UE support on large yellow noodle with CGA to min assist for stabilization of noodle for balance  Marching in place 10 reps x 2 sets with UE support on yellow noodle; marching across pool (18') with UE support on noodle x 2 reps with CGA for balance  Heel raises 10 reps  with UE support on pool edge Squats - small range - 10 reps - with bil. UE support on large yellow noodle  Pt performed standing hip flexion, abduction and extension 10 reps each leg holding onto pool noodle, stabilized by PT, for support and assist with balance  Pt amb. Forward across width of pool (18') x 4 reps at end of session - holding onto bar bells - cues to move bar bells forward/back    PATIENT EDUCATION: Education details: gave pt & caregiver info on aquatic therapy, Access Code: [email or web address] (07/21/22) Person educated: Patient and Caregiver   Education method: Explanation Education comprehension: verbalized understanding   HOME EXERCISE PROGRAM: Access Code: [email or web address] URL: [email or web address] Date: 07/21/2022 Prepared by: Claiborne Billings  Exercises - Seated Long Arc Quad  - 3 x daily - 7 x weekly - 2 sets - 10 reps - 5 hold - Seated March   - 3 x daily - 7 x weekly - 3 sets - 10 reps - Seated Heel Toe Raises   - 3 x daily - 7 x weekly - 2 sets - 10 reps - Sit to Stand  - 2 x daily - 7 x weekly - 2 sets - 5 reps GOALS: Goals reviewed with patient? Yes   LONG TERM GOALS: Target date: 09/04/22     1.  Pt will perform aquatic exercise program and land based exercises with caregiver's assistance.  Baseline:  Goal status:  Not met, aquatic exercise was not completed as land exercises were recommended by MD.  2.  Pt will increase endurance so that she is able to participate in 45" aquatic  therapy session with </= min. C/o fatigue. Baseline:  Goal status: Not met  3.  Pt will report at least 50% improvement in Rt hip pain after completion of aquatic therapy sessions.  Baseline:  Goal status: Not met  4.  Pt will report at least 50% improvement in Rt hip pain to increase ease and comfort with mobility and ADL's.  Baseline:  new occurrence of Rt hip pain on 06-30-22 - pt unable to rate intensity of pain  Goal status:  Pt unable to verbalize a %.  5. Perform 5x sit to stand in < or = to 19 seconds to improve balance Baseline: 26 seconds (07/21/22) NEW: Goal status: Not met, pt declined  6. Perform TUG in < or 16 seconds to improve balance  Baseline: 21 seconds  NEW Goal Status: Pt declined    ASSESSMENT:  CLINICAL IMPRESSION: Pt arrived to PT with her caregiver. Pt has declined attending her previous Pt sessions. She is not interested in participating in any exercise that she feels she had been given at some time before. She was able to clearly articulate she enjoys going to [place] to participate in the senior aerobics because it is fun to do. She reports she may not want to do it every week but that is the one activity she would do.Pt and caregiver agree to discharge from formal Pt today.    OBJECTIVE IMPAIRMENTS decreased endurance, difficulty walking, decreased strength, and pain.   ACTIVITY LIMITATIONS bending, squatting, transfers, and locomotion level  PARTICIPATION LIMITATIONS: meal prep, laundry, shopping, and community activity  PERSONAL FACTORS Age and Past/current experiences are also affecting patient's functional outcome.   REHAB POTENTIAL: Good  CLINICAL DECISION MAKING: Stable/uncomplicated  EVALUATION COMPLEXITY: Low  PLAN: PT FREQUENCY: 3x/wk (2 land, 1 aquatic)  PT DURATION: 6 weeks   PLANNED INTERVENTIONS: Patient/Family education, Self Care, and Aquatic Therapy  PLAN FOR NEXT SESSION: Discharge to HEP and [facility] exercise classes  starting tomorrow.   Myrene Galas, PTA 08/16/22 11:52 AM  PHYSICAL THERAPY DISCHARGE SUMMARY  Visits from Start of Care: 7  Current functional level related to goals / functional outcomes: See above for current status.  Pt has requested D/C.    Remaining deficits: See above   Education / Equipment: HEP, transition back to [place]   Patient agrees to discharge. Patient goals were not met. Patient is being discharged due to the patient's request.  Sigurd Sos, PT 08/16/22 1:17 PM    [facility] [address] [place], [place] [address] Phone # [phone] Fax [phone]

## 2022-08-19 ENCOUNTER — Ambulatory Visit: Payer: PPO

## 2022-08-25 ENCOUNTER — Encounter: Payer: Self-pay | Admitting: Family Medicine

## 2022-08-25 ENCOUNTER — Other Ambulatory Visit: Payer: PPO | Admitting: Family Medicine

## 2022-08-25 VITALS — BP 110/60 | HR 78 | Temp 98.1°F | Resp 16 | Wt 84.5 lb

## 2022-08-25 DIAGNOSIS — J449 Chronic obstructive pulmonary disease, unspecified: Secondary | ICD-10-CM | POA: Diagnosis not present

## 2022-08-25 DIAGNOSIS — I350 Nonrheumatic aortic (valve) stenosis: Secondary | ICD-10-CM | POA: Diagnosis not present

## 2022-08-25 DIAGNOSIS — Z515 Encounter for palliative care: Secondary | ICD-10-CM

## 2022-08-25 NOTE — Progress Notes (Signed)
  [facility] Consult Note Telephone: [phone]  Fax: [phone]    Date of encounter: 08/25/22 12:15 pm PATIENT NAME: Valerie Villa [address] [place] [address]   [phone] (home)  DOB: 09/23/1924 MRN: [id number] PRIMARY CARE PROVIDER:    Raju, Sneha P, MD,  [address] [place] [address] [phone]  REFERRING PROVIDER:   Raju, Sneha P, MD [address] [place],   [address] [phone]  RESPONSIBLE PARTY:    Contact Information     Name Relation Home Work Mobile   DELAPP,ANNE Other   [phone]   Portier,Beth Daughter   [phone]   Sowell,Everett Son [phone]     Lochra,Katherine Daughter   [phone]   Stickley,John Son   [phone]        I met face to face with patient and caregiver Anne Delapp in pt's home. Palliative Care was asked to follow this patient by consultation request of  Raju, Sneha P, MD to address advance care planning and complex medical decision making. This is a follow up visit   ASSESSMENT , SYMPTOM MANAGEMENT AND PLAN / RECOMMENDATIONS:   Nonrheumatic aortic stenosis Stable, asymptomatic with stable volume status.   Mild COPD Stable, minimal symptom burden with no recent infections or increased need for rescue inhaler Continue current therapy   Palliative Care Encounter Stable with no significant Palliative needs, will d/c to return to PCP care. Can refer back if pt has a significant decline in intake with weight loss and loss of function.    Follow up Palliative Care Visit: Palliative care will defer pt back to PCP for follow up and can be referred back to Palliative Care at a later date if she declines significantly.    This visit was coded based on medical decision making (MDM).  PPS: 60%  HOSPICE ELIGIBILITY/DIAGNOSIS: TBD  Chief Complaint:  Received call from pt's caregiver requesting follow up visit.  Visit  mutually agreed upon today.  Palliative Care is following for chromic medical management in setting of aortic stenosis and COPD.  HISTORY OF PRESENT ILLNESS:  Valerie Villa is a 88 y.o. year old female with non-rheumatic aortic stenosis, COPD and osteoarthritis.  Per Caregiver, Anne, pt had a fall and began having pain in her right knee radiating up to her hip and in her groin.  She went to see PCP and lumbar and hip xrays ordered at [facility] facility.  Caregiver states they would not allow her to go with pt, laid her down then rolled her on her right side with her legs crossed to get the xray of her right hip and pt was screaming with pain.  She states pt did not have a fracture and was told she had arthritis in her back and right hip. She was encouraged to go to PT and has been multiple times this year.  She went in and was doing the same exercises she had been doing that had not improved her function or decreased her pain previously.  Pt indicated that getting up and down from a chair was not useful to her in trying to do some of her normal activities so now she is refusing to return to the doctor who ordered the xrays and to PT.  She has gone to do water classes at [facility] on at least 2 occasions but this is very taxing for her in being able to get ready, take all her things, change   into her swimwear, get in the pool and then when she gets out her caregiver indicates this is a 3 hour process which causes her anxiety in getting ready and is very fatiguing for her for the remainder of the day.  Denies CP, SOB except on exertion.  Only 1 fall which has been a while.  Denies dysuria, nausea, vomiting.  Her appetite is very good and caregiver states she has gained a pound in the last month.  Both caregiver and her daughter indicated some concern as pt has new onset edema in bilateral feet.  She denies orthopnea/PND.  She recently went shopping with caregiver so she has no activity limits except for  arthritis pain.     History obtained from review of EMR, discussion with primary team, and interview with family, facility staff/caregiver and/or Ms. Mcnease.   I reviewed EMR for available labs, medications, imaging, studies and related documents.  There are no new records since last visit.   ROS General: NAD EYES: denies vision changes ENMT: denies dysphagia Cardiovascular: denies chest pain, endorses some mild DOE, bipedal edema Pulmonary: denies cough, denies increased SOB Abdomen: endorses good appetite, denies constipation, endorses continence of bowel GU: denies dysuria, endorses continence of urine MSK:  has some BLE weakness,  has pain intermittently and limited ROM in right hip, single fall reported Skin: denies rashes or wounds Neurological: denies insomnia Psych: Endorses positive mood Heme/lymph/immuno: denies bruises, abnormal bleeding  Physical Exam:: Constitutional: NAD General: underweight, thin, WD ENMT: hard of hearing CV: S1S2, RRR with audible murmur loudest over RSB but audible with holosystolic quality across precordium, 1+  Bilat pedal edema Pulmonary: CTAB, no increased work of breathing, no cough, room air Abdomen:  normo-active BS + 4 quadrants, soft and non tender MSK: no sarcopenia, moves all extremities, ambulatory Skin: warm and dry, no rashes or wounds on visible skin Neuro:  no generalized weakness,  no cognitive impairment Psych: non-anxious affect, A and O x 3 Hem/lymph/immuno: no widespread bruising   Thank you for the opportunity to participate in the care of Valerie Villa.  The palliative care team will continue to follow. Please call our office at [phone] if we can be of additional assistance.   Marijo Conception, FNP -C  COVID-19 PATIENT SCREENING TOOL Asked and negative response unless otherwise noted:   Have you had symptoms of covid, tested positive or been in contact with someone with symptoms/positive test in the past 5-10 days?   no

## 2022-08-31 DIAGNOSIS — I129 Hypertensive chronic kidney disease with stage 1 through stage 4 chronic kidney disease, or unspecified chronic kidney disease: Secondary | ICD-10-CM | POA: Diagnosis not present

## 2022-08-31 DIAGNOSIS — E46 Unspecified protein-calorie malnutrition: Secondary | ICD-10-CM | POA: Diagnosis not present

## 2022-08-31 DIAGNOSIS — R636 Underweight: Secondary | ICD-10-CM | POA: Diagnosis not present

## 2022-08-31 DIAGNOSIS — S81801A Unspecified open wound, right lower leg, initial encounter: Secondary | ICD-10-CM | POA: Diagnosis not present

## 2022-09-06 ENCOUNTER — Encounter: Payer: PPO | Admitting: Physical Therapy

## 2022-09-06 DIAGNOSIS — R451 Restlessness and agitation: Secondary | ICD-10-CM | POA: Diagnosis not present

## 2022-09-13 ENCOUNTER — Encounter: Payer: PPO | Admitting: Physical Therapy

## 2022-09-20 ENCOUNTER — Encounter: Payer: PPO | Admitting: Physical Therapy

## 2022-09-22 DIAGNOSIS — N390 Urinary tract infection, site not specified: Secondary | ICD-10-CM | POA: Diagnosis not present

## 2022-10-15 DIAGNOSIS — R399 Unspecified symptoms and signs involving the genitourinary system: Secondary | ICD-10-CM | POA: Diagnosis not present

## 2022-10-20 DIAGNOSIS — H31002 Unspecified chorioretinal scars, left eye: Secondary | ICD-10-CM | POA: Diagnosis not present

## 2022-10-20 DIAGNOSIS — L0390 Cellulitis, unspecified: Secondary | ICD-10-CM | POA: Diagnosis not present

## 2022-10-20 DIAGNOSIS — H5712 Ocular pain, left eye: Secondary | ICD-10-CM | POA: Diagnosis not present

## 2022-10-20 DIAGNOSIS — Z79899 Other long term (current) drug therapy: Secondary | ICD-10-CM | POA: Diagnosis not present

## 2022-10-20 DIAGNOSIS — H5310 Unspecified subjective visual disturbances: Secondary | ICD-10-CM | POA: Diagnosis not present

## 2022-10-20 DIAGNOSIS — F419 Anxiety disorder, unspecified: Secondary | ICD-10-CM | POA: Diagnosis not present

## 2022-10-20 DIAGNOSIS — H43812 Vitreous degeneration, left eye: Secondary | ICD-10-CM | POA: Diagnosis not present

## 2022-10-27 DIAGNOSIS — S80811A Abrasion, right lower leg, initial encounter: Secondary | ICD-10-CM | POA: Diagnosis not present

## 2022-10-27 DIAGNOSIS — Z85828 Personal history of other malignant neoplasm of skin: Secondary | ICD-10-CM | POA: Diagnosis not present

## 2023-03-05 DEATH — deceased

## 2023-12-28 ENCOUNTER — Other Ambulatory Visit (HOSPITAL_BASED_OUTPATIENT_CLINIC_OR_DEPARTMENT_OTHER): Payer: Self-pay
